# Patient Record
Sex: Female | Born: 1950 | Race: White | Hispanic: No | Marital: Married | Smoking: Former smoker
Health system: Southern US, Community
[De-identification: ages and names within clinical notes are randomized; demographics above are authoritative.]

## PROBLEM LIST (undated history)

## (undated) DIAGNOSIS — R34 Anuria and oliguria: Secondary | ICD-10-CM

## (undated) DIAGNOSIS — K859 Acute pancreatitis without necrosis or infection, unspecified: Secondary | ICD-10-CM

## (undated) DIAGNOSIS — E119 Type 2 diabetes mellitus without complications: Secondary | ICD-10-CM

## (undated) DIAGNOSIS — T4145XA Adverse effect of unspecified anesthetic, initial encounter: Secondary | ICD-10-CM

## (undated) DIAGNOSIS — R82991 Hypocitraturia: Secondary | ICD-10-CM

## (undated) DIAGNOSIS — K567 Ileus, unspecified: Secondary | ICD-10-CM

## (undated) DIAGNOSIS — G4730 Sleep apnea, unspecified: Secondary | ICD-10-CM

## (undated) DIAGNOSIS — K219 Gastro-esophageal reflux disease without esophagitis: Secondary | ICD-10-CM

## (undated) DIAGNOSIS — M9953 Intervertebral disc stenosis of neural canal of lumbar region: Secondary | ICD-10-CM

## (undated) DIAGNOSIS — M722 Plantar fascial fibromatosis: Secondary | ICD-10-CM

## (undated) DIAGNOSIS — D649 Anemia, unspecified: Secondary | ICD-10-CM

## (undated) DIAGNOSIS — R112 Nausea with vomiting, unspecified: Secondary | ICD-10-CM

## (undated) DIAGNOSIS — J45909 Unspecified asthma, uncomplicated: Secondary | ICD-10-CM

## (undated) DIAGNOSIS — K589 Irritable bowel syndrome without diarrhea: Secondary | ICD-10-CM

## (undated) DIAGNOSIS — M533 Sacrococcygeal disorders, not elsewhere classified: Secondary | ICD-10-CM

## (undated) DIAGNOSIS — N289 Disorder of kidney and ureter, unspecified: Secondary | ICD-10-CM

## (undated) DIAGNOSIS — N200 Calculus of kidney: Secondary | ICD-10-CM

## (undated) DIAGNOSIS — E785 Hyperlipidemia, unspecified: Secondary | ICD-10-CM

## (undated) DIAGNOSIS — R1084 Generalized abdominal pain: Secondary | ICD-10-CM

## (undated) DIAGNOSIS — R200 Anesthesia of skin: Secondary | ICD-10-CM

## (undated) DIAGNOSIS — M48062 Spinal stenosis, lumbar region with neurogenic claudication: Secondary | ICD-10-CM

## (undated) DIAGNOSIS — T8859XA Other complications of anesthesia, initial encounter: Secondary | ICD-10-CM

## (undated) DIAGNOSIS — Z9889 Other specified postprocedural states: Secondary | ICD-10-CM

## (undated) DIAGNOSIS — M5136 Other intervertebral disc degeneration, lumbar region: Secondary | ICD-10-CM

## (undated) DIAGNOSIS — K56609 Unspecified intestinal obstruction, unspecified as to partial versus complete obstruction: Secondary | ICD-10-CM

## (undated) HISTORY — PX: PILONIDAL CYST EXCISION: SHX744

## (undated) HISTORY — PX: ABDOMINAL HYSTERECTOMY: SHX81

## (undated) HISTORY — PX: MICRODISCECTOMY LUMBAR: SUR864

## (undated) HISTORY — PX: COLONOSCOPY: SHX174

## (undated) HISTORY — DX: Irritable bowel syndrome, unspecified: K58.9

## (undated) HISTORY — DX: Type 2 diabetes mellitus without complications: E11.9

## (undated) HISTORY — DX: Calculus of kidney: N20.0

## (undated) HISTORY — DX: Spinal stenosis, lumbar region with neurogenic claudication: M48.062

## (undated) HISTORY — DX: Generalized abdominal pain: R10.84

## (undated) HISTORY — PX: EYE SURGERY: SHX253

## (undated) HISTORY — DX: Anemia, unspecified: D64.9

## (undated) HISTORY — DX: Anuria and oliguria: R34

## (undated) HISTORY — DX: Plantar fascial fibromatosis: M72.2

## (undated) HISTORY — DX: Ileus, unspecified: K56.7

## (undated) HISTORY — DX: Other intervertebral disc degeneration, lumbar region: M51.36

## (undated) HISTORY — DX: Hyperlipidemia, unspecified: E78.5

## (undated) HISTORY — DX: Unspecified intestinal obstruction, unspecified as to partial versus complete obstruction: K56.609

## (undated) HISTORY — PX: BACK SURGERY: SHX140

## (undated) HISTORY — PX: CHOLECYSTECTOMY: SHX55

## (undated) HISTORY — DX: Hypocitraturia: R82.991

## (undated) HISTORY — PX: APPENDECTOMY: SHX54

## (undated) HISTORY — DX: Disorder of kidney and ureter, unspecified: N28.9

## (undated) HISTORY — DX: Anesthesia of skin: R20.0

## (undated) HISTORY — DX: Intervertebral disc stenosis of neural canal of lumbar region: M99.53

## (undated) HISTORY — DX: Sacrococcygeal disorders, not elsewhere classified: M53.3

---
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
## 2004-06-28 ENCOUNTER — Emergency Department: Payer: Self-pay | Admitting: Unknown Physician Specialty

## 2004-06-28 ENCOUNTER — Other Ambulatory Visit: Payer: Self-pay

## 2004-07-15 ENCOUNTER — Ambulatory Visit: Payer: Self-pay | Admitting: Family Medicine

## 2004-07-16 ENCOUNTER — Ambulatory Visit: Payer: Self-pay | Admitting: Family Medicine

## 2004-08-23 ENCOUNTER — Ambulatory Visit: Payer: Self-pay | Admitting: Unknown Physician Specialty

## 2004-11-16 ENCOUNTER — Ambulatory Visit: Payer: Self-pay | Admitting: Family Medicine

## 2006-07-18 ENCOUNTER — Ambulatory Visit: Payer: Self-pay | Admitting: Family Medicine

## 2006-08-08 ENCOUNTER — Ambulatory Visit: Payer: Self-pay | Admitting: Family Medicine

## 2007-10-30 ENCOUNTER — Ambulatory Visit: Payer: Self-pay | Admitting: Family Medicine

## 2008-04-07 ENCOUNTER — Ambulatory Visit: Payer: Self-pay | Admitting: Unknown Physician Specialty

## 2008-11-27 ENCOUNTER — Ambulatory Visit: Payer: Self-pay | Admitting: Family Medicine

## 2009-04-22 ENCOUNTER — Ambulatory Visit: Payer: Self-pay | Admitting: Family Medicine

## 2010-03-16 ENCOUNTER — Ambulatory Visit: Payer: Self-pay | Admitting: Family Medicine

## 2011-07-26 ENCOUNTER — Ambulatory Visit: Payer: Self-pay | Admitting: Family Medicine

## 2012-06-06 DIAGNOSIS — R34 Anuria and oliguria: Secondary | ICD-10-CM

## 2012-06-06 DIAGNOSIS — R82991 Hypocitraturia: Secondary | ICD-10-CM | POA: Insufficient documentation

## 2012-06-06 DIAGNOSIS — N200 Calculus of kidney: Secondary | ICD-10-CM | POA: Insufficient documentation

## 2012-06-06 HISTORY — DX: Anuria and oliguria: R34

## 2012-06-06 HISTORY — DX: Hypocitraturia: R82.991

## 2012-07-21 ENCOUNTER — Emergency Department: Payer: Self-pay | Admitting: Emergency Medicine

## 2012-07-21 LAB — COMPREHENSIVE METABOLIC PANEL
Albumin: 3.7 g/dL (ref 3.4–5.0)
Alkaline Phosphatase: 97 U/L (ref 50–136)
BUN: 16 mg/dL (ref 7–18)
Calcium, Total: 9.5 mg/dL (ref 8.5–10.1)
Chloride: 108 mmol/L — ABNORMAL HIGH (ref 98–107)
Co2: 27 mmol/L (ref 21–32)
Creatinine: 1.13 mg/dL (ref 0.60–1.30)
EGFR (African American): 60 — ABNORMAL LOW
EGFR (Non-African Amer.): 52 — ABNORMAL LOW
Osmolality: 282 (ref 275–301)
SGOT(AST): 24 U/L (ref 15–37)
SGPT (ALT): 23 U/L (ref 12–78)
Sodium: 140 mmol/L (ref 136–145)
Total Protein: 7.8 g/dL (ref 6.4–8.2)

## 2012-07-21 LAB — LIPASE, BLOOD: Lipase: 378 U/L (ref 73–393)

## 2012-07-21 LAB — CBC
HCT: 45.2 % (ref 35.0–47.0)
HGB: 15.7 g/dL (ref 12.0–16.0)
MCH: 30.5 pg (ref 26.0–34.0)
MCHC: 34.8 g/dL (ref 32.0–36.0)
RDW: 13 % (ref 11.5–14.5)

## 2012-07-21 LAB — URINALYSIS, COMPLETE
Bilirubin,UR: NEGATIVE
Glucose,UR: NEGATIVE mg/dL (ref 0–75)
Ketone: NEGATIVE
Nitrite: NEGATIVE
Protein: NEGATIVE
RBC,UR: 15 /HPF (ref 0–5)
Specific Gravity: 1.016 (ref 1.003–1.030)
WBC UR: 46 /HPF (ref 0–5)

## 2012-07-26 ENCOUNTER — Ambulatory Visit: Payer: Self-pay | Admitting: Family Medicine

## 2013-07-25 DIAGNOSIS — E119 Type 2 diabetes mellitus without complications: Secondary | ICD-10-CM

## 2013-07-25 HISTORY — DX: Type 2 diabetes mellitus without complications: E11.9

## 2014-01-27 ENCOUNTER — Ambulatory Visit: Payer: Self-pay | Admitting: Family Medicine

## 2014-02-28 HISTORY — PX: MICRODISCECTOMY LUMBAR: SUR864

## 2014-05-07 ENCOUNTER — Other Ambulatory Visit: Payer: Self-pay | Admitting: Family Medicine

## 2014-05-14 ENCOUNTER — Inpatient Hospital Stay: Payer: Self-pay | Admitting: Surgery

## 2014-06-29 NOTE — H&P (Signed)
Subjective/Chief Complaint abdominal pain and distension   History of Present Illness 64 y/o female with type II diabetes presents to ER with about a 24 hours history of severe diffuse abdominal pain with audible bowel sounds starting actually yesterday afternoon,  She had multiple loose stools and last passage of flatus she thinks was yesterday.  No emesis until asked to drink oral contrast for CT scan.  Prior surgical history includes open chole and appendctomy many years ago.  No fevers, no jaundice, no sick contacts, no history of diverticulitis.  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  No prior history of bowel obstructive type symptoms.  recently treated for bronchitis thought secondary to reflux by Her PCP.   Past History obesity DM Renal stones Atrophic right kidney prior open chole open appy   Past Medical Health Diabetes Mellitus   Code Status Full Code   Past Med/Surgical Hx:  Diabetes Mellitus, Type II (NIDD):   Gall Bladder removal:   Appendectomy:   ALLERGIES:  Codeine: Itching  PCN: Rash  HOME MEDICATIONS: Medication Instructions Status  metFORMIN 500 mg oral tablet 1 tab(s) orally 2 times a day Active  Probiotic Formula - oral capsule 1 cap(s) orally once a day Active  Claritin 10 mg oral tablet 1 tab(s) orally once a day Active  Zantac 150 mg oral tablet 1 tab(s) orally 2 times a day Active   Family and Social History:  Family History Diabetes Mellitus   Social History negative tobacco, negative ETOH   Place of Living Home   Review of Systems:  Subjective/Chief Complaint see above, remaining 10 point review otherwise negative or non-contributory.   Fever/Chills No   Cough Yes  recent episode of bronchitis treated with steroids and abx 2 weeks ago.   Sputum No   Abdominal Pain Yes   Diarrhea Yes   Nausea/Vomiting Yes   SOB/DOE No   Chest Pain No   Tolerating Diet No  Nauseated    Medications/Allergies Reviewed Medications/Allergies reviewed   Physical Exam:  GEN no acute distress, obese, disheveled   HEENT pale conjunctivae, PERRL, hearing intact to voice   NECK supple   RESP normal resp effort  clear BS   CARD regular rate   ABD positive tenderness  no hernia  soft  distended  right paramedial scar   LYMPH negative neck   EXTR negative cyanosis/clubbing, negative edema   SKIN normal to palpation, No rashes   NEURO cranial nerves intact   PSYCH A+O to time, place, person   Lab Results:  Hepatic:  16-Mar-16 05:25   Bilirubin, Total 0.7 (0.3-1.2 NOTE: New Reference Range  04/22/14)  Alkaline Phosphatase 87 (38-126 NOTE: New Reference Range  04/22/14)  SGPT (ALT) 21 (14-54 NOTE: New Reference Range  04/22/14)  SGOT (AST) 26 (15-41 NOTE: New Reference Range  04/22/14)  Total Protein, Serum 7.3 (6.5-8.1 NOTE: New Reference Range  04/22/14)  Albumin, Serum 3.8 (3.5-5.0 NOTE: New reference range  04/22/14)  Routine Chem:  16-Mar-16 05:25   Lipase  69 (22-51 NOTE: New Reference Range  04/22/14)  Glucose, Serum  163 (65-99 NOTE: New Reference Range  04/22/14)  BUN  23 (6-20 NOTE: New Reference Range  04/22/14)  Creatinine (comp)  1.17 (0.44-1.00 NOTE: New Reference Range  04/22/14)  Sodium, Serum 139 (135-145 NOTE: New Reference Range  04/22/14)  Potassium, Serum 4.0 (3.5-5.1 NOTE: New Reference Range  04/22/14)  Chloride, Serum 104 (101-111 NOTE: New Reference Range  04/22/14)  CO2, Serum 25 (22-32 NOTE: New Reference Range  04/22/14)  Calcium (Total), Serum 10.0 (8.9-10.3 NOTE: New Reference Range  04/22/14)  eGFR (African American)  57  eGFR (Non-African American)  49 (eGFR values <60mL/min/1.73 m2 may be an indication of chronic kidney disease (CKD). Calculated eGFR is useful in patients with stable renal function. The eGFR calculation will not be reliable in acutely ill patients when serum creatinine is changing  rapidly. It is not useful in patients on dialysis. The eGFR calculation may not be applicable to patients at the low and high extremes of body sizes, pregnant women, and vegetarians.)  Anion Gap 10  Cardiac:  16-Mar-16 05:25   Troponin I <0.03 (0.00-0.03 0.03 ng/mL or less: NEGATIVE  Repeat testing in 3-6 hrs  if clinically indicated. >0.03 ng/mL: POTENTIAL  MYOCARDIAL INJURY. Repeat  testing in 3-6 hrs if  clinically indicated. NOTE: An increase or decrease  of 30% or more on serial  testing suggests a  clinically important change NOTE: New Reference Range  04/22/14)  Routine UA:  16-Mar-16 07:26   Color (UA) Yellow  Clarity (UA) Hazy  Glucose (UA) Negative  Bilirubin (UA) Negative  Ketones (UA) Negative  Specific Gravity (UA) 1.017  Blood (UA) 2+  pH (UA) 5.0  Protein (UA) Negative  Nitrite (UA) Negative  Leukocyte Esterase (UA) 1+ (Result(s) reported on 14 May 2014 at 07:51AM.)  RBC (UA) 1 /HPF  WBC (UA) 25 /HPF  Bacteria (UA) NONE SEEN  Epithelial Cells (UA) 4 /HPF  Mucous (UA) PRESENT  Hyaline Cast (UA) 10 /LPF (Result(s) reported on 14 May 2014 at 07:51AM.)  Routine Hem:  16-Mar-16 05:25   WBC (CBC)  17.9  RBC (CBC)  5.62  Hemoglobin (CBC)  16.2  Hematocrit (CBC)  49.6  Platelet Count (CBC) 328 (Result(s) reported on 14 May 2014 at 06:12AM.)  MCV 88  MCH 28.8  MCHC 32.7  RDW 13.0   Radiology Results: LabUnknown:    16-Mar-16 09:22, CT Abdomen and Pelvis With Contrast  PACS Image  CT:  CT Abdomen and Pelvis With Contrast  REASON FOR EXAM:    (1) upper abd pain; (2) upper abd pain  COMMENTS:       PROCEDURE: CT  - CT ABDOMEN / PELVIS  W  - May 14 2014  9:22AM     CLINICAL DATA:  Severe abdominal pain for the past 12-13 hours.  Nausea, vomiting and diarrhea. History of 1 functioning kidney with  multiple prior kidney infections.    EXAM:  CT ABDOMEN AND PELVIS WITH CONTRAST    TECHNIQUE:  Multidetector CT imaging of the abdomen and pelvis was  performed  using the standard protocol following bolus administration of  intravenous contrast.    CONTRAST:  85 cc Omnipaque 300    COMPARISON:  CT abdomen pelvis - 04/22/2009    FINDINGS:  There is moderate distention of multiple loops of small bowel with  apparent transition site within the anterior aspect of the right mid  hemi abdomen (axial image 48, series 2, coronal image 34, series 6).  There is feculent material seen within knee adjacent small bowel  loop within the right mid hemi abdomen (representative image 55,  series 2). A definitive the etiology of this is obstruction is not  identified. No pneumoperitoneum, pneumatosis or portal venous gas.  There is a trace amount of fluid seen within the right pericolic  gutter and pelvic cul-de-sac without a definable/drainable  intra-abdominal fluid collection.    Scattered   colonic diverticulosis without evidence of diverticulitis.  The appendix is not visualized compatible with provided surgical  history.    Normal hepatic contour. No discrete hepatic lesions. Post  cholecystectomy with mild centralized intrahepatic biliary duct  dilatation, presumably the sequela of reservoir phenomenon. No  ascites.    There is symmetric enhancement and excretion of the bilateral  kidneys. The right kidney remains atrophic. There are multiple (at  least 7) punctate nonobstructing stones seen throughout the atrophic  right kidney with dominant stone within the inferior pole of the  right kidney measuring approximately 6 mm in diameter (coronal image  95, series 6). No definite left-sided renal stones. No stones are  seen along the expected course of either ureter or the urinary  bladder. Normal appearance of the urinary bladder given degree  distention. Sub cm hypo attenuating lesion within the superior  aspect of the right kidney (image 39, series 2) is too small to  actually characterize of favored to represent renal cysts.  No  discrete left-sided renal lesions. No urinary obstruction or  perinephric stranding.    Normal appearance of the bilateral adrenal glands and spleen.    Punctate (approximately 2 mm) calcifications about the head of the  pancreas (representative images 29 and 33, series 2) are unchanged  since the 2011 examination and while nonspecific could be the  sequela of prior episode of pancreatitis. Currently, there is no  peripancreatic stranding. There is homogeneous enhancement of the  pancreatic parenchyma. No definitive pancreatic ductal dilatation.    Moderate to large amount of eccentric mixed calcified and  noncalcified atherosclerotic plaque within a normal caliber  abdominal aorta. Eccentric calcified plaque involving the origin of  the bilateral common iliac arteries (image 53, series 2) not  definitely resulting in hemodynamically significant stenosis.    No bulky retroperitoneal, mesenteric, pelvic or inguinal  lymphadenopathy.    Post hysterectomy. There is a trace amount of fluid within the  pelvic cul-de-sac. No discrete ovarian or adnexal lesion.    Limited visualization of the lower thorax demonstrates minimal  grossly symmetric subpleural ground-glass atelectasis. No discrete  focal airspace opacities. No pleural effusion.    Normal heart size.  No pericardial effusion.    No acute or aggressive osseous abnormalities. Mild to moderate  multilevel lumbar spine DDD with multilevel small posteriorly  directed disc osteophyte complexes throughout the lower lumbar  spine. Mild degenerative change of the right hip. Incidental note is  made of a small os acetabula. Regional soft tissues appear normal.     IMPRESSION:  1. Findings worrisome for developing small bowel obstruction with  transition point within the anterior aspect of the right mid hemi  abdomen. A definitive etiology of this suspected obstruction is not  depicted and thus presumably secondary to adhesions.  No evidence of  perforation or definable/drainable fluid collection.  2. Similar findings of asymmetric right-sided renal atrophy.  3. Multiple punctate nonobstructing right-sided renal stones with  dominant stone within the inferior pole the right kidney measuring 6  mm in diameter. No evidence of left-sided nephrolithiasis.  4. Punctate calcifications about the pancreatic head are unchanged  since the 03/2009 examination and while nonspecific potentially the  sequela of prior episode of pancreatitis. No CT evidence of acute  pancreatitis.  5. Moderate to large amount of calcified plaque within a normal  caliber abdominal aorta.  Electronically Signed    By: Sandi Mariscal M.D.    On: 05/14/2014 09:48  Verified By: JOHN A. WATTS, M.D.,    Assessment/Admission Diagnosis 63 y/o female with obesity, Type II Dm, Atrophic right kidney and what looks like adhesive SBO  Personally spoke with Dr Stafford regarding her care and presentation and ER course  Personally reviewed all CT scan images on PACS.   Plan Admit, hydrate, SSI, NPO, NGT, serial exams, am 2 way series  Discussed in detail with patient and her spouse treatment course including purpose of NGT and possiblity of operative intervention if obtrsuction does not resolve.  They voiced understanding and wich to proceed.   Electronic Signatures: ,  (MD)  (Signed 16-Mar-16 18:28)  Authored: CHIEF COMPLAINT and HISTORY, PAST MEDICAL/SURGIAL HISTORY, ALLERGIES, HOME MEDICATIONS, FAMILY AND SOCIAL HISTORY, REVIEW OF SYSTEMS, PHYSICAL EXAM, LABS, Radiology, ASSESSMENT AND PLAN   Last Updated: 16-Mar-16 18:28 by ,  (MD) 

## 2014-06-29 NOTE — Discharge Summary (Signed)
PATIENT NAME:  Octavio GravesQUIRING, [name] DATE OF BIRTH:  16-Jun-1950  DATE OF ADMISSION:  05/14/2014 DATE OF DISCHARGE:  05/17/2014  FINAL DIAGNOSIS: Adhesive small bowel obstruction.  PRINCIPAL PROCEDURES: CT scan of the abdomen and pelvis, abdominal x-ray, serial abdominal examinations, nasogastric tube decompression.   HOSPITAL COURSE SUMMARY: The patient was admitted to the hospital with a nasogastric tube for small bowel obstruction. She had fairly rapid resolution of her obstruction with passage of gas by postoperative day number 1. Followup consult films done on that day demonstrated contrast seen within the colon from the prior CT scan. By hospital day number 2, the patient continued to improve and was tolerating full liquids after a trial of nasogastric tube to gravity drainage and then following its removal. She did have some discomfort in her epigastric region. A follow up x-ray demonstrated a nonspecific, nonobstructive bowel gas pattern. On the 19th of March, the patient continued to improve, passing gas, tolerating a regular diet and was stable for discharge.   DISCHARGE MEDICATIONS: Metformin 500 mg by mouth b.i.d., probiotic capsule, Claritin 10 mg by mouth once a day, Zantac 150 mg by mouth b.i.d.   DISPOSITION: She is to call the office for any questions or concerns. Follow up in my office next week.   ____________________________ Redge GainerMark A. Egbert GaribaldiBird, MD mab:sb D: 05/22/2014 08:57:06 ET T: 05/22/2014 14:24:18 ET JOB#: [id number]  cc: Loraine LericheMark A. Egbert GaribaldiBird, MD, <Dictator> Raynald KempMARK A Shanta Dorvil MD ELECTRONICALLY SIGNED 05/26/2014 23:16

## 2014-10-09 ENCOUNTER — Other Ambulatory Visit: Payer: Self-pay | Admitting: Physician Assistant

## 2014-10-09 DIAGNOSIS — R200 Anesthesia of skin: Secondary | ICD-10-CM

## 2014-10-09 DIAGNOSIS — M5416 Radiculopathy, lumbar region: Secondary | ICD-10-CM

## 2014-10-09 DIAGNOSIS — M51369 Other intervertebral disc degeneration, lumbar region without mention of lumbar back pain or lower extremity pain: Secondary | ICD-10-CM

## 2014-10-09 DIAGNOSIS — M5136 Other intervertebral disc degeneration, lumbar region: Secondary | ICD-10-CM

## 2014-10-09 HISTORY — DX: Other intervertebral disc degeneration, lumbar region: M51.36

## 2014-10-09 HISTORY — DX: Other intervertebral disc degeneration, lumbar region without mention of lumbar back pain or lower extremity pain: M51.369

## 2014-10-09 HISTORY — DX: Anesthesia of skin: R20.0

## 2014-10-16 ENCOUNTER — Ambulatory Visit: Payer: Self-pay

## 2015-01-01 DIAGNOSIS — M9953 Intervertebral disc stenosis of neural canal of lumbar region: Secondary | ICD-10-CM

## 2015-01-01 HISTORY — DX: Intervertebral disc stenosis of neural canal of lumbar region: M99.53

## 2015-01-19 DIAGNOSIS — M48062 Spinal stenosis, lumbar region with neurogenic claudication: Secondary | ICD-10-CM | POA: Insufficient documentation

## 2015-01-19 HISTORY — DX: Spinal stenosis, lumbar region with neurogenic claudication: M48.062

## 2015-04-23 ENCOUNTER — Other Ambulatory Visit: Payer: Self-pay | Admitting: Family Medicine

## 2015-04-23 DIAGNOSIS — Z1231 Encounter for screening mammogram for malignant neoplasm of breast: Secondary | ICD-10-CM

## 2015-05-11 ENCOUNTER — Ambulatory Visit: Payer: Self-pay | Attending: Family Medicine

## 2015-05-12 ENCOUNTER — Ambulatory Visit
Admission: RE | Admit: 2015-05-12 | Discharge: 2015-05-12 | Disposition: A | Discharge status: Home / Self Care | Payer: Medicare Other | Source: Ambulatory Visit | Attending: Family Medicine | Admitting: Family Medicine

## 2015-05-12 ENCOUNTER — Other Ambulatory Visit: Payer: Self-pay | Admitting: Family Medicine

## 2015-05-12 DIAGNOSIS — Z1231 Encounter for screening mammogram for malignant neoplasm of breast: Secondary | ICD-10-CM

## 2015-10-22 DIAGNOSIS — M533 Sacrococcygeal disorders, not elsewhere classified: Secondary | ICD-10-CM

## 2015-10-22 HISTORY — DX: Sacrococcygeal disorders, not elsewhere classified: M53.3

## 2016-04-12 ENCOUNTER — Emergency Department: Payer: Medicare Other

## 2016-04-12 ENCOUNTER — Inpatient Hospital Stay
Admission: EM | Admit: 2016-04-12 | Discharge: 2016-04-15 | DRG: 390 | Disposition: A | Discharge status: Home / Self Care | Payer: Medicare Other | Attending: Surgery | Admitting: Surgery

## 2016-04-12 ENCOUNTER — Encounter: Payer: Self-pay | Admitting: Emergency Medicine

## 2016-04-12 ENCOUNTER — Observation Stay: Payer: Medicare Other

## 2016-04-12 DIAGNOSIS — R1084 Generalized abdominal pain: Secondary | ICD-10-CM | POA: Insufficient documentation

## 2016-04-12 DIAGNOSIS — R109 Unspecified abdominal pain: Secondary | ICD-10-CM

## 2016-04-12 DIAGNOSIS — Z6835 Body mass index (BMI) 35.0-35.9, adult: Secondary | ICD-10-CM

## 2016-04-12 DIAGNOSIS — Z885 Allergy status to narcotic agent status: Secondary | ICD-10-CM

## 2016-04-12 DIAGNOSIS — K56609 Unspecified intestinal obstruction, unspecified as to partial versus complete obstruction: Secondary | ICD-10-CM | POA: Diagnosis present

## 2016-04-12 DIAGNOSIS — Z91048 Other nonmedicinal substance allergy status: Secondary | ICD-10-CM

## 2016-04-12 DIAGNOSIS — K567 Ileus, unspecified: Secondary | ICD-10-CM

## 2016-04-12 DIAGNOSIS — Z9049 Acquired absence of other specified parts of digestive tract: Secondary | ICD-10-CM

## 2016-04-12 DIAGNOSIS — Z90710 Acquired absence of both cervix and uterus: Secondary | ICD-10-CM

## 2016-04-12 DIAGNOSIS — K56600 Partial intestinal obstruction, unspecified as to cause: Principal | ICD-10-CM | POA: Diagnosis present

## 2016-04-12 DIAGNOSIS — Z880 Allergy status to penicillin: Secondary | ICD-10-CM

## 2016-04-12 DIAGNOSIS — E119 Type 2 diabetes mellitus without complications: Secondary | ICD-10-CM | POA: Diagnosis present

## 2016-04-12 HISTORY — DX: Type 2 diabetes mellitus without complications: E11.9

## 2016-04-12 LAB — CBC
HCT: 48.6 % — ABNORMAL HIGH (ref 35.0–47.0)
HEMATOCRIT: 46.2 % (ref 35.0–47.0)
HEMOGLOBIN: 15.4 g/dL (ref 12.0–16.0)
Hemoglobin: 16.6 g/dL — ABNORMAL HIGH (ref 12.0–16.0)
MCH: 29.3 pg (ref 26.0–34.0)
MCH: 28.7 pg (ref 26.0–34.0)
MCHC: 34.2 g/dL (ref 32.0–36.0)
MCHC: 33.3 g/dL (ref 32.0–36.0)
MCV: 85.6 fL (ref 80.0–100.0)
MCV: 86.4 fL (ref 80.0–100.0)
PLATELETS: 234 10*3/uL (ref 150–440)
Platelets: 203 10*3/uL (ref 150–440)
RBC: 5.68 MIL/uL — ABNORMAL HIGH (ref 3.80–5.20)
RBC: 5.35 MIL/uL — AB (ref 3.80–5.20)
RDW: 13.2 % (ref 11.5–14.5)
RDW: 13.1 % (ref 11.5–14.5)
WBC: 11.9 10*3/uL — ABNORMAL HIGH (ref 3.6–11.0)
WBC: 15 10*3/uL — ABNORMAL HIGH (ref 3.6–11.0)

## 2016-04-12 LAB — CREATININE, SERUM
Creatinine, Ser: 1.32 mg/dL — ABNORMAL HIGH (ref 0.44–1.00)
GFR calc Af Amer: 47 mL/min — ABNORMAL LOW (ref >60)
GFR, EST NON AFRICAN AMERICAN: 41 mL/min — AB (ref >60)

## 2016-04-12 LAB — URINALYSIS, COMPLETE (UACMP) WITH MICROSCOPIC
Bacteria, UA: NONE SEEN
Bilirubin Urine: NEGATIVE
GLUCOSE, UA: NEGATIVE mg/dL
Hgb urine dipstick: NEGATIVE
Ketones, ur: NEGATIVE mg/dL
Nitrite: NEGATIVE
PH: 5 (ref 5.0–8.0)
Protein, ur: 30 mg/dL — AB
RBC / HPF: NONE SEEN RBC/hpf (ref 0–5)
Specific Gravity, Urine: 1.02 (ref 1.005–1.030)

## 2016-04-12 LAB — COMPREHENSIVE METABOLIC PANEL
ALK PHOS: 64 U/L (ref 38–126)
ALT: 18 U/L (ref 14–54)
ANION GAP: 10 (ref 5–15)
AST: 37 U/L (ref 15–41)
Albumin: 4.5 g/dL (ref 3.5–5.0)
BILIRUBIN TOTAL: 0.7 mg/dL (ref 0.3–1.2)
BUN: 14 mg/dL (ref 6–20)
CALCIUM: 10.8 mg/dL — AB (ref 8.9–10.3)
CO2: 25 mmol/L (ref 22–32)
CREATININE: 1.12 mg/dL — AB (ref 0.44–1.00)
Chloride: 105 mmol/L (ref 101–111)
GFR calc Af Amer: 58 mL/min — ABNORMAL LOW (ref >60)
GFR calc non Af Amer: 50 mL/min — ABNORMAL LOW (ref >60)
GLUCOSE: 192 mg/dL — AB (ref 65–99)
Potassium: 4.6 mmol/L (ref 3.5–5.1)
Sodium: 140 mmol/L (ref 135–145)
TOTAL PROTEIN: 7.9 g/dL (ref 6.5–8.1)

## 2016-04-12 LAB — LIPASE, BLOOD: Lipase: 44 U/L (ref 11–51)

## 2016-04-12 MED ORDER — ONDANSETRON HCL 4 MG/2ML IJ SOLN
INTRAMUSCULAR | Status: AC
  Administered 2016-04-12: 4 mg via INTRAVENOUS
  Filled 2016-04-12: qty 2

## 2016-04-12 MED ORDER — MORPHINE SULFATE (PF) 4 MG/ML IV SOLN
4.0000 mg | Freq: Once | INTRAVENOUS | Status: AC
  Administered 2016-04-12: 4 mg via INTRAVENOUS
  Filled 2016-04-12: qty 1

## 2016-04-12 MED ORDER — ONDANSETRON HCL 4 MG/2ML IJ SOLN
4.0000 mg | Freq: Once | INTRAMUSCULAR | Status: AC
  Administered 2016-04-12: 4 mg via INTRAVENOUS

## 2016-04-12 MED ORDER — HEPARIN SODIUM (PORCINE) 5000 UNIT/ML IJ SOLN
5000.0000 [IU] | Freq: Three times a day (TID) | INTRAMUSCULAR | Status: DC
  Administered 2016-04-12 – 2016-04-15 (×8): 5000 [IU] via SUBCUTANEOUS
  Filled 2016-04-12 (×9): qty 1

## 2016-04-12 MED ORDER — IOPAMIDOL (ISOVUE-300) INJECTION 61%
30.0000 mL | Freq: Once | INTRAVENOUS | Status: AC | PRN
  Administered 2016-04-12: 30 mL via ORAL

## 2016-04-12 MED ORDER — ONDANSETRON HCL 4 MG PO TABS: 4.0000 mg | ORAL_TABLET | Freq: Four times a day (QID) | ORAL | Status: DC | PRN

## 2016-04-12 MED ORDER — IOPAMIDOL (ISOVUE-300) INJECTION 61%
100.0000 mL | Freq: Once | INTRAVENOUS | Status: AC | PRN
  Administered 2016-04-12: 100 mL via INTRAVENOUS

## 2016-04-12 MED ORDER — PANTOPRAZOLE SODIUM 40 MG IV SOLR
40.0000 mg | Freq: Two times a day (BID) | INTRAVENOUS | Status: DC
  Administered 2016-04-12 – 2016-04-15 (×6): 40 mg via INTRAVENOUS
  Filled 2016-04-12 (×6): qty 40

## 2016-04-12 MED ORDER — HYDROMORPHONE HCL 1 MG/ML IJ SOLN
0.5000 mg | INTRAMUSCULAR | Status: DC | PRN
  Administered 2016-04-12 – 2016-04-13 (×4): 0.5 mg via INTRAVENOUS
  Filled 2016-04-12 (×3): qty 0.5
  Filled 2016-04-12: qty 1

## 2016-04-12 MED ORDER — FENTANYL CITRATE (PF) 100 MCG/2ML IJ SOLN
75.0000 ug | Freq: Once | INTRAMUSCULAR | Status: AC
  Administered 2016-04-12: 75 ug via INTRAVENOUS
  Filled 2016-04-12: qty 2

## 2016-04-12 MED ORDER — ONDANSETRON HCL 4 MG/2ML IJ SOLN
4.0000 mg | Freq: Once | INTRAMUSCULAR | Status: AC
  Administered 2016-04-12: 4 mg via INTRAVENOUS
  Filled 2016-04-12: qty 2

## 2016-04-12 MED ORDER — ONDANSETRON HCL 4 MG/2ML IJ SOLN
4.0000 mg | Freq: Four times a day (QID) | INTRAMUSCULAR | Status: DC | PRN
  Administered 2016-04-12 – 2016-04-14 (×5): 4 mg via INTRAVENOUS
  Filled 2016-04-12 (×6): qty 2

## 2016-04-12 MED ORDER — LACTATED RINGERS IV SOLN
INTRAVENOUS | Status: DC
  Administered 2016-04-12 – 2016-04-14 (×7): via INTRAVENOUS

## 2016-04-12 NOTE — H&P (Signed)
Alexa Abbott is an 66 y.o. female.    Chief Complaint: Nausea vomiting  HPI: This patient with a 24-hour history of nausea and vomiting she and her husband 8 dinner the night before home cooked and both of them woke up with abdominal discomfort but his spontaneously resolved. Hers progressed to where she vomited a single time about 800 cc she's had some upper abdominal pain but no bowel movements and no passage of gas since yesterday. This is unusual for her and that she says she has IBS and has up to 10 loose stools per day. This is very unusual for her to not have bowel movements or pass gas.  She had this happen once before and was admitted to the hospital with a nasogastric tube by Dr. Marina Gravel. It spontaneously resolved without surgery.  Of interest is multiple surgeries in the past including hysterectomy cholecystectomy. She has a long paramedian and Kocher type scar.  Past Medical History:  Diagnosis Date  . Diabetes mellitus without complication ([place])     History reviewed. No pertinent surgical history.  No family history on file. There is no family history of bowel obstruction but there is a history of diabetes. Social History:  reports that she has never smoked. She has never used smokeless tobacco. She reports that she drinks alcohol. Her drug history is not on file.  Allergies:  Allergies  Allergen Reactions  . Adhesive [Tape]   . Codeine Itching  . Penicillins Rash    Has patient had a PCN reaction causing immediate rash, facial/tongue/throat swelling, SOB or lightheadedness with hypotension: yes Has patient had a PCN reaction causing severe rash involving mucus membranes or skin necrosis: no Has patient had a PCN reaction that required hospitalization no Has patient had a PCN reaction occurring within the last 10 years: no If all of the above answers are "NO", then may proceed with Cephalosporin use.      (Not in a hospital admission)   Review of Systems   Constitutional: Negative.   HENT: Negative.   Eyes: Negative.   Respiratory: Negative.   Cardiovascular: Negative.   Gastrointestinal: Positive for abdominal pain, heartburn, nausea and vomiting. Negative for blood in stool, constipation, diarrhea and melena.  Genitourinary: Negative.   Musculoskeletal: Negative.   Skin: Negative.   Neurological: Negative.   Endo/Heme/Allergies: Negative.   Psychiatric/Behavioral: Negative.      Physical Exam:  BP (!) 163/83   Pulse 100   Temp 98.9 F (37.2 C) (Oral)   Resp (!) 29   Ht '5\' 1"'$  (1.549 m)   Wt 190 lb 6.4 oz (86.4 kg)   SpO2 98%   BMI 35.98 kg/m   Physical Exam  Constitutional: She is oriented to person, place, and time and well-developed, well-nourished, and in no distress. No distress.  Morbidly obese no acute distress  HENT:  Head: Normocephalic and atraumatic.  Eyes: Pupils are equal, round, and reactive to light. Right eye exhibits no discharge. Left eye exhibits no discharge. No scleral icterus.  Neck: Normal range of motion.  Cardiovascular: Normal rate, regular rhythm and normal heart sounds.   Pulmonary/Chest: Effort normal and breath sounds normal. No respiratory distress. She has no wheezes. She has no rales.  Abdominal: Soft. She exhibits distension. There is tenderness. There is no rebound and no guarding.  Musculoskeletal: Normal range of motion. She exhibits no edema or tenderness.  Lymphadenopathy:    She has no cervical adenopathy.  Neurological: She is alert and oriented to person,  place, and time.  Skin: Skin is warm and dry. She is not diaphoretic. No erythema. No pallor.  Psychiatric: Mood and affect normal.  Vitals reviewed.       Results for orders placed or performed during the hospital encounter of 04/12/16 (from the past 48 hour(s))  Lipase, blood     Status: None   Collection Time: 04/12/16  2:53 PM  Result Value Ref Range   Lipase 44 11 - 51 U/L  Comprehensive metabolic panel      Status: Abnormal   Collection Time: 04/12/16  2:53 PM  Result Value Ref Range   Sodium 140 135 - 145 mmol/L   Potassium 4.6 3.5 - 5.1 mmol/L   Chloride 105 101 - 111 mmol/L   CO2 25 22 - 32 mmol/L   Glucose, Bld 192 (H) 65 - 99 mg/dL   BUN 14 6 - 20 mg/dL   Creatinine, Ser 1.12 (H) 0.44 - 1.00 mg/dL   Calcium 10.8 (H) 8.9 - 10.3 mg/dL   Total Protein 7.9 6.5 - 8.1 g/dL   Albumin 4.5 3.5 - 5.0 g/dL   AST 37 15 - 41 U/L   ALT 18 14 - 54 U/L   Alkaline Phosphatase 64 38 - 126 U/L   Total Bilirubin 0.7 0.3 - 1.2 mg/dL   GFR calc non Af Amer 50 (L) >60 mL/min   GFR calc Af Amer 58 (L) >60 mL/min    Comment: (NOTE) The eGFR has been calculated using the CKD EPI equation. This calculation has not been validated in all clinical situations. eGFR's persistently <60 mL/min signify possible Chronic Kidney Disease.    Anion gap 10 5 - 15  CBC     Status: Abnormal   Collection Time: 04/12/16  2:53 PM  Result Value Ref Range   WBC 11.9 (H) 3.6 - 11.0 K/uL   RBC 5.68 (H) 3.80 - 5.20 MIL/uL   Hemoglobin 16.6 (H) 12.0 - 16.0 g/dL   HCT 48.6 (H) 35.0 - 47.0 %   MCV 85.6 80.0 - 100.0 fL   MCH 29.3 26.0 - 34.0 pg   MCHC 34.2 32.0 - 36.0 g/dL   RDW 13.2 11.5 - 14.5 %   Platelets 234 150 - 440 K/uL  Urinalysis, Complete w Microscopic     Status: Abnormal   Collection Time: 04/12/16  5:30 PM  Result Value Ref Range   Color, Urine YELLOW (A) YELLOW   APPearance CLEAR (A) CLEAR   Specific Gravity, Urine 1.020 1.005 - 1.030   pH 5.0 5.0 - 8.0   Glucose, UA NEGATIVE NEGATIVE mg/dL   Hgb urine dipstick NEGATIVE NEGATIVE   Bilirubin Urine NEGATIVE NEGATIVE   Ketones, ur NEGATIVE NEGATIVE mg/dL   Protein, ur 30 (A) NEGATIVE mg/dL   Nitrite NEGATIVE NEGATIVE   Leukocytes, UA TRACE (A) NEGATIVE   RBC / HPF NONE SEEN 0 - 5 RBC/hpf   WBC, UA 0-5 0 - 5 WBC/hpf   Bacteria, UA NONE SEEN NONE SEEN   Squamous Epithelial / LPF 0-5 (A) NONE SEEN   Mucous PRESENT    Hyaline Casts, UA PRESENT    Ct  Abdomen Pelvis W Contrast  Result Date: 04/12/2016 CLINICAL DATA:  Acute onset of generalized abdominal pain and nausea. Lack of bowel movements. Initial encounter. EXAM: CT ABDOMEN AND PELVIS WITH CONTRAST TECHNIQUE: Multidetector CT imaging of the abdomen and pelvis was performed using the standard protocol following bolus administration of intravenous contrast. CONTRAST:  135m ISOVUE-300 IOPAMIDOL (ISOVUE-300) INJECTION 61%  COMPARISON:  CT of the abdomen and pelvis from 05/14/2014 FINDINGS: Lower chest: The visualized lung bases are grossly clear. Scattered coronary artery calcifications are seen. Hepatobiliary: The patient is status post cholecystectomy. There is minimal prominence of the intrahepatic biliary ducts, within normal limits. The liver is unremarkable in appearance. The common bile duct remains normal in caliber. Pancreas: The pancreas is within normal limits. Spleen: The spleen is unremarkable in appearance. Adrenals/Urinary Tract: The adrenal glands are unremarkable in appearance. The kidneys are within normal limits Mild right renal atrophy and scarring is noted, with numerous right renal stones measuring up to 9 mm in size. There is no evidence of hydronephrosis. No obstructing ureteral stones are seen. Minimal nonspecific left-sided perinephric stranding is noted. The left kidney is otherwise unremarkable. Stomach/Bowel: There is distention of small-bowel loops up to 3.8 cm in diameter, with gradual decompression at the right lower quadrant. No focal transition point or fecalization is seen. This may reflect some degree of small bowel dysmotility. The stomach is partially filled with fluid and air, and is grossly unremarkable in appearance. The appendix is not well characterized; there is no evidence of appendicitis. The colon is grossly unremarkable in appearance. Vascular/Lymphatic: Scattered calcification is seen along the abdominal aorta and its branches. Minimal mural thrombus is noted  along the distal abdominal aorta, without evidence of significant luminal narrowing. The abdominal aorta is otherwise grossly unremarkable. The inferior vena cava is grossly unremarkable. No retroperitoneal lymphadenopathy is seen. No pelvic sidewall lymphadenopathy is identified. Reproductive: The bladder is mildly distended and grossly unremarkable. The patient is status post hysterectomy. The ovaries are relatively symmetric. No suspicious adnexal masses are seen. Other: No additional soft tissue abnormalities are seen. Musculoskeletal: No acute osseous abnormalities are identified. Anterior bridging osteophytes are noted along the lower thoracic spine. Small posterior disc protrusions are seen along the lumbar spine. The visualized musculature is unremarkable in appearance. IMPRESSION: 1. Distention of small-bowel loops up to 3.8 cm in diameter, with gradual decompression at the right lower quadrant. No focal transition point or fecalization seen. This may reflect some degree of small bowel dysmotility, without definite evidence for bowel obstruction at this time. Would follow-up with the patient's symptoms. 2. Mild right renal atrophy and scarring, with numerous right renal stones measuring up to 9 mm in size. 3. Scattered aortic atherosclerosis. Minimal mural thrombus along the distal abdominal aorta, without significant luminal narrowing. 4. Scattered coronary artery calcifications seen. 5. Mild degenerative change along the lower thoracic and lumbar spine. Electronically Signed   By: Garald Balding M.D.   On: 04/12/2016 17:38   Dg Abdomen Acute W/chest  Result Date: 04/12/2016 CLINICAL DATA:  Epigastric pain. EXAM: DG ABDOMEN ACUTE W/ 1V CHEST COMPARISON:  05/16/2014. FINDINGS: Mediastinum hilar structures normal. Heart size normal. No focal infiltrate. Low lung volumes. Distended loops of small bowel noted. Developing small bowel obstruction cannot be excluded . Colonic gas pattern is normal. Stool in  colon. Follow-up abdominal series suggested to demonstrate resolution of small bowel distention. Calcifications in the pelvis consistent phleboliths. IMPRESSION: Dilated loops of small bowel. Developing small bowel obstruction cannot be excluded. Follow-up abdominal series suggested. Electronically Signed   By: Marcello Moores  Register   On: 04/12/2016 15:56     Assessment/Plan  This patient with a probable small bowel obstruction. She's had a single emesis of large volume and that necessitating its and nasogastric tube which I think would benefit her greatly. Plan would be for admission to the hospital nasogastric tube placement  and hydration and follow-up films in the morning. This discussed with she and her husband they understood and agreed with this plan she has had this happen before in a similar fashion and spontaneously resolved.  Florene Glen, MD, FACS

## 2016-04-12 NOTE — ED Notes (Signed)
Patient transported to CT 

## 2016-04-12 NOTE — ED Triage Notes (Addendum)
Pt with abd pain started last night and continues on today. Pt with hx of bowel obstruction, normally has 5-10 BM's per day, pt with IBS. Pt has not had BM today. Pt states she had not vomited but is very nauseated.

## 2016-04-12 NOTE — ED Notes (Signed)
Pt's O2 saturation dropped to 87% on room air, pt denied feeling SOB. 2L of oxygen applied to pt, saturation rose to 94%. Will continue to monitor.

## 2016-04-12 NOTE — ED Provider Notes (Signed)
[facility] Emergency Department Provider Note ____________________________________________   I have reviewed the triage vital signs and the triage nursing note.  HISTORY  Chief Complaint Abdominal Pain and Constipation   Historian Patient  HPI Alexa Abbott is a 66 y.o. female history of diabetes as well as a small bowel obstruction approximately 2 years ago that resolved with 3 days of conservative management Hospital, presents today with abdominal pain that started last evening and is now diffuse and associated with abdominal bloating. She has not had a bowel movement since yesterday and typically would have up to 10 per day. She is nauseated without vomiting. Pain is 7 out of 10. Pain is diffuse but more so on the right lower quadrant which is where her previous bowel obstruction pain occurred as well.    Past Medical History:  Diagnosis Date  . Diabetes mellitus without complication (HCC)     There are no active problems to display for this patient.   History reviewed. No pertinent surgical history.  Prior to Admission medications   Medication Sig Start Date End Date Taking? Authorizing Provider  acetaminophen (TYLENOL) 500 MG tablet Take 1,000 mg by mouth every 6 (six) hours as needed.   Yes Historical Provider, MD  Lactobacillus (PROBIOTIC ACIDOPHILUS PO) Take 1 tablet by mouth daily.   Yes Historical Provider, MD  loratadine (CLARITIN) 10 MG tablet Take 10 mg by mouth daily.   Yes Historical Provider, MD  metFORMIN (GLUCOPHAGE-XR) 500 MG 24 hr tablet Take 500 mg by mouth 2 (two) times daily. 12/11/15  Yes Historical Provider, MD  ranitidine (ZANTAC) 150 MG tablet Take 150 mg by mouth 2 (two) times daily. 01/28/16  Yes Historical Provider, MD    Allergies  Allergen Reactions  . Adhesive [Tape]   . Codeine Itching  . Penicillins Rash    Has patient had a PCN reaction causing immediate rash, facial/tongue/throat swelling, SOB or lightheadedness  with hypotension: yes Has patient had a PCN reaction causing severe rash involving mucus membranes or skin necrosis: no Has patient had a PCN reaction that required hospitalization no Has patient had a PCN reaction occurring within the last 10 years: no If all of the above answers are "NO", then may proceed with Cephalosporin use.     No family history on file.  Social History Social History  Substance Use Topics  . Smoking status: Never Smoker  . Smokeless tobacco: Never Used  . Alcohol use Yes     Comment: occasionally    Review of Systems  Constitutional: Negative for fever. Eyes: Negative for visual changes. ENT: Negative for sore throat. Cardiovascular: Negative for chest pain. Respiratory: Negative for shortness of breath. Gastrointestinal: Negative for vomiting and diarrhea. Genitourinary: Negative for dysuria. Musculoskeletal: Negative for back pain. Skin: Negative for rash. Neurological: Negative for headache. 10 point Review of Systems otherwise negative ____________________________________________   PHYSICAL EXAM:  VITAL SIGNS: ED Triage Vitals  Enc Vitals Group     BP 04/12/16 1443 (!) 179/87     Pulse Rate 04/12/16 1443 (!) 109     Resp 04/12/16 1443 20     Temp 04/12/16 1443 98.9 F (37.2 C)     Temp Source 04/12/16 1443 Oral     SpO2 04/12/16 1443 97 %     Weight 04/12/16 1444 190 lb 6.4 oz (86.4 kg)     Height 04/12/16 1444 5\' 1"  (1.549 m)     Head Circumference --      Peak Flow --  Pain Score 04/12/16 1444 10     Pain Loc --      Pain Edu? --      Excl. in GC? --      Constitutional: Alert and oriented. Well appearing and in no distress. HEENT   Head: Normocephalic and atraumatic.      Eyes: Conjunctivae are normal. PERRL. Normal extraocular movements.      Ears:         Nose: No congestion/rhinnorhea.   Mouth/Throat: Mucous membranes are moist.   Neck: No stridor. Cardiovascular/Chest: Normal rate, regular rhythm.  No  murmurs, rubs, or gallops. Respiratory: Normal respiratory effort without tachypnea nor retractions. Breath sounds are clear and equal bilaterally. No wheezes/rales/rhonchi. Gastrointestinal: Soft. Moderate distention. Moderate diffuse tenderness without guarding or rebound.   Genitourinary/rectal:Deferred Musculoskeletal: Nontender with normal range of motion in all extremities. No joint effusions.  No lower extremity tenderness.  No edema. Neurologic:  Normal speech and language. No gross or focal neurologic deficits are appreciated. Skin:  Skin is warm, dry and intact. No rash noted. Psychiatric: Mood and affect are normal. Speech and behavior are normal. Patient exhibits appropriate insight and judgment.   ____________________________________________  LABS (pertinent positives/negatives)  Labs Reviewed  COMPREHENSIVE METABOLIC PANEL - Abnormal; Notable for the following:       Result Value   Glucose, Bld 192 (*)    Creatinine, Ser 1.12 (*)    Calcium 10.8 (*)    GFR calc non Af Amer 50 (*)    GFR calc Af Amer 58 (*)    All other components within normal limits  CBC - Abnormal; Notable for the following:    WBC 11.9 (*)    RBC 5.68 (*)    Hemoglobin 16.6 (*)    HCT 48.6 (*)    All other components within normal limits  URINALYSIS, COMPLETE (UACMP) WITH MICROSCOPIC - Abnormal; Notable for the following:    Color, Urine YELLOW (*)    APPearance CLEAR (*)    Protein, ur 30 (*)    Leukocytes, UA TRACE (*)    Squamous Epithelial / LPF 0-5 (*)    All other components within normal limits  LIPASE, BLOOD    ____________________________________________    EKG I, Governor Rooksebecca Travus Oren, MD, the attending physician have personally viewed and interpreted all ECGs.  94 bpm. Normal sinus rhythm. Left axis deviation. Nonspecific ST and T-wave. ____________________________________________  RADIOLOGY All Xrays were viewed by me. Imaging interpreted by Radiologist.  Abdomen with chest  x-rays:  IMPRESSION: Dilated loops of small bowel. Developing small bowel obstruction cannot be excluded. Follow-up abdominal series suggested.  CT abdomen and pelvis with contrast:  IMPRESSION: 1. Distention of small-bowel loops up to 3.8 cm in diameter, with gradual decompression at the right lower quadrant. No focal transition point or fecalization seen. This may reflect some degree of small bowel dysmotility, without definite evidence for bowel obstruction at this time. Would follow-up with the patient's symptoms. 2. Mild right renal atrophy and scarring, with numerous right renal stones measuring up to 9 mm in size. 3. Scattered aortic atherosclerosis. Minimal mural thrombus along the distal abdominal aorta, without significant luminal narrowing. 4. Scattered coronary artery calcifications seen. 5. Mild degenerative change along the lower thoracic and lumbar spine. __________________________________________  PROCEDURES  Procedure(s) performed: None  Critical Care performed: None  ____________________________________________   ED COURSE / ASSESSMENT AND PLAN  Pertinent labs & imaging results that were available during my care of the patient were reviewed by me and  considered in my medical decision making (see chart for details).   Symptoms are clinically concerning for small bowel obstruction. She does not have acute abdomen on exam, but was sent for x-rays, showing developing small bowel obstruction potentially. CT scan showed no focal transition point, however there are dilated small bowel loops.  Patient was treated with symptomatic medications, and required ongoing/repeated narcotics for pain control. Gen. Surgery consultation for admission.      CONSULTATIONS:  Dr Excell Seltzer, general surgery for admission.  Patient / Family / Caregiver informed of clinical course, medical decision-making process, and agree with  plan.    ___________________________________________   FINAL CLINICAL IMPRESSION(S) / ED DIAGNOSES   Final diagnoses:  Abdominal pain, generalized              Note: This dictation was prepared with Dragon dictation. Any transcriptional errors that result from this process are unintentional    Governor Rooks, MD 04/12/16 [phone]

## 2016-04-12 NOTE — ED Notes (Signed)
Pt had 1 episode of emesis. MD notified.

## 2016-04-12 NOTE — ED Notes (Signed)
Patient transported to X-ray 

## 2016-04-13 ENCOUNTER — Observation Stay: Payer: Medicare Other

## 2016-04-13 DIAGNOSIS — K56609 Unspecified intestinal obstruction, unspecified as to partial versus complete obstruction: Secondary | ICD-10-CM | POA: Diagnosis present

## 2016-04-13 DIAGNOSIS — Z90710 Acquired absence of both cervix and uterus: Secondary | ICD-10-CM | POA: Diagnosis not present

## 2016-04-13 DIAGNOSIS — Z6835 Body mass index (BMI) 35.0-35.9, adult: Secondary | ICD-10-CM | POA: Diagnosis not present

## 2016-04-13 DIAGNOSIS — Z91048 Other nonmedicinal substance allergy status: Secondary | ICD-10-CM | POA: Diagnosis not present

## 2016-04-13 DIAGNOSIS — E119 Type 2 diabetes mellitus without complications: Secondary | ICD-10-CM | POA: Diagnosis present

## 2016-04-13 DIAGNOSIS — R1084 Generalized abdominal pain: Secondary | ICD-10-CM | POA: Diagnosis present

## 2016-04-13 DIAGNOSIS — K567 Ileus, unspecified: Secondary | ICD-10-CM | POA: Diagnosis not present

## 2016-04-13 DIAGNOSIS — Z885 Allergy status to narcotic agent status: Secondary | ICD-10-CM | POA: Diagnosis not present

## 2016-04-13 DIAGNOSIS — Z880 Allergy status to penicillin: Secondary | ICD-10-CM | POA: Diagnosis not present

## 2016-04-13 DIAGNOSIS — Z9049 Acquired absence of other specified parts of digestive tract: Secondary | ICD-10-CM | POA: Diagnosis not present

## 2016-04-13 DIAGNOSIS — K56600 Partial intestinal obstruction, unspecified as to cause: Secondary | ICD-10-CM | POA: Diagnosis present

## 2016-04-13 LAB — BASIC METABOLIC PANEL
ANION GAP: 6 (ref 5–15)
BUN: 14 mg/dL (ref 6–20)
CALCIUM: 9.6 mg/dL (ref 8.9–10.3)
CO2: 28 mmol/L (ref 22–32)
CREATININE: 1.27 mg/dL — AB (ref 0.44–1.00)
Chloride: 105 mmol/L (ref 101–111)
GFR calc Af Amer: 49 mL/min — ABNORMAL LOW (ref >60)
GFR calc non Af Amer: 43 mL/min — ABNORMAL LOW (ref >60)
Glucose, Bld: 117 mg/dL — ABNORMAL HIGH (ref 65–99)
Potassium: 4.9 mmol/L (ref 3.5–5.1)
SODIUM: 139 mmol/L (ref 135–145)

## 2016-04-13 LAB — CBC
HCT: 38.8 % (ref 35.0–47.0)
HEMOGLOBIN: 13.7 g/dL (ref 12.0–16.0)
MCH: 30.2 pg (ref 26.0–34.0)
MCHC: 35.2 g/dL (ref 32.0–36.0)
MCV: 85.6 fL (ref 80.0–100.0)
PLATELETS: 163 10*3/uL (ref 150–440)
RBC: 4.53 MIL/uL (ref 3.80–5.20)
RDW: 13.1 % (ref 11.5–14.5)
WBC: 7.4 10*3/uL (ref 3.6–11.0)

## 2016-04-13 MED ORDER — MUPIROCIN 2 % EX OINT
TOPICAL_OINTMENT | Freq: Two times a day (BID) | CUTANEOUS | Status: DC
  Administered 2016-04-13 – 2016-04-14 (×3): via NASAL
  Administered 2016-04-15: 1 via NASAL
  Filled 2016-04-13: qty 22

## 2016-04-13 NOTE — Care Management Obs Status (Signed)
MEDICARE OBSERVATION STATUS NOTIFICATION   Patient Details  Name: Alexa GravesDonna B Abbott MRN: [id number] Date of Birth: [birth year]   Medicare Observation Status Notification Given:  No (Attempted to give obs letter.  Patient vomiting this time.  )    Chapman FitchBOWEN, Ashwini Jago T, RN 04/13/2016, 4:30 PM

## 2016-04-13 NOTE — Progress Notes (Signed)
Dr Everlene FarrierPabon gave orders for NPO except ice chips

## 2016-04-13 NOTE — Progress Notes (Signed)
CC: ileus Subjective: Pt feeling better, some abdominal pain but has improved. No flatus KUB personally reviewed contrast in the colon, no bowel distension\  Objective: Vital signs in last 24 hours: Temp:  [97.5 F (36.4 C)-98.9 F (37.2 C)] 97.6 F (36.4 C) (02/14 1253) Pulse Rate:  [78-109] 78 (02/14 1253) Resp:  [14-29] 18 (02/14 1253) BP: (104-179)/(44-91) 128/50 (02/14 1253) SpO2:  [92 %-99 %] 93 % (02/14 1253) Weight:  [86.4 kg (190 lb 6.4 oz)] 86.4 kg (190 lb 6.4 oz) (02/13 1444) Last BM Date: 04/11/16  Intake/Output from previous day: 02/13 0701 - 02/14 0700 In: 165 [I.V.:165] Out: 300 [Urine:200; Emesis/NG output:100] Intake/Output this shift: Total I/O In: 872.5 [I.V.:872.5] Out: 650 [Urine:500; Emesis/NG output:150]  Physical exam: NAD awake alert Abd: soft, Nt, no peritonitis, no distension\ Ext: well perfused, no edema   Lab Results: CBC   Recent Labs  04/12/16 2058 04/13/16 0624  WBC 15.0* 7.4  HGB 15.4 13.7  HCT 46.2 38.8  PLT 203 163   BMET  Recent Labs  04/12/16 1453 04/12/16 2058 04/13/16 0624  NA 140  --  139  K 4.6  --  4.9  CL 105  --  105  CO2 25  --  28  GLUCOSE 192*  --  117*  BUN 14  --  14  CREATININE 1.12* 1.32* 1.27*  CALCIUM 10.8*  --  9.6   PT/INR No results for input(s): LABPROT, INR in the last 72 hours. ABG No results for input(s): PHART, HCO3 in the last 72 hours.  Invalid input(s): PCO2, PO2  Studies/Results: Dg Abdomen 1 View  Result Date: 04/12/2016 CLINICAL DATA:  NG tube placement EXAM: ABDOMEN - 1 VIEW COMPARISON:  CT from earlier on the same day FINDINGS: Gastric tube projects within the body of the stomach. Gas-filled mildly distended stomach. Scattered air containing small bowel loops are seen in the left lower quadrant. No free air. Pyelograms are seen from prior contrast administration within both kidneys more so on the right with slight blunting of the calices. Lower lumbar degenerative disc disease  and facet arthropathy. IMPRESSION: Gastric tube in the stomach. Mild gaseous distention of small bowel loops similar to CT findings of possibly representing small bowel dysmotility bold or small bowel obstruction. Electronically Signed   By: Tollie Eth M.D.   On: 04/12/2016 22:53   Ct Abdomen Pelvis W Contrast  Result Date: 04/12/2016 CLINICAL DATA:  Acute onset of generalized abdominal pain and nausea. Lack of bowel movements. Initial encounter. EXAM: CT ABDOMEN AND PELVIS WITH CONTRAST TECHNIQUE: Multidetector CT imaging of the abdomen and pelvis was performed using the standard protocol following bolus administration of intravenous contrast. CONTRAST:  ISOVUE-300 IOPAMIDOL (ISOVUE-300) INJECTION 61% COMPARISON:  CT of the abdomen and pelvis from 05/14/2014 FINDINGS: Lower chest: The visualized lung bases are grossly clear. Scattered coronary artery calcifications are seen. Hepatobiliary: The patient is status post cholecystectomy. There is minimal prominence of the intrahepatic biliary ducts, within normal limits. The liver is unremarkable in appearance. The common bile duct remains normal in caliber. Pancreas: The pancreas is within normal limits. Spleen: The spleen is unremarkable in appearance. Adrenals/Urinary Tract: The adrenal glands are unremarkable in appearance. The kidneys are within normal limits Mild right renal atrophy and scarring is noted, with numerous right renal stones measuring up to 9 mm in size. There is no evidence of hydronephrosis. No obstructing ureteral stones are seen. Minimal nonspecific left-sided perinephric stranding is noted. The left kidney is otherwise unremarkable.  Stomach/Bowel: There is distention of small-bowel loops up to 3.8 cm in diameter, with gradual decompression at the right lower quadrant. No focal transition point or fecalization is seen. This may reflect some degree of small bowel dysmotility. The stomach is partially filled with fluid and air, and is  grossly unremarkable in appearance. The appendix is not well characterized; there is no evidence of appendicitis. The colon is grossly unremarkable in appearance. Vascular/Lymphatic: Scattered calcification is seen along the abdominal aorta and its branches. Minimal mural thrombus is noted along the distal abdominal aorta, without evidence of significant luminal narrowing. The abdominal aorta is otherwise grossly unremarkable. The inferior vena cava is grossly unremarkable. No retroperitoneal lymphadenopathy is seen. No pelvic sidewall lymphadenopathy is identified. Reproductive: The bladder is mildly distended and grossly unremarkable. The patient is status post hysterectomy. The ovaries are relatively symmetric. No suspicious adnexal masses are seen. Other: No additional soft tissue abnormalities are seen. Musculoskeletal: No acute osseous abnormalities are identified. Anterior bridging osteophytes are noted along the lower thoracic spine. Small posterior disc protrusions are seen along the lumbar spine. The visualized musculature is unremarkable in appearance. IMPRESSION: 1. Distention of small-bowel loops up to 3.8 cm in diameter, with gradual decompression at the right lower quadrant. No focal transition point or fecalization seen. This may reflect some degree of small bowel dysmotility, without definite evidence for bowel obstruction at this time. Would follow-up with the patient's symptoms. 2. Mild right renal atrophy and scarring, with numerous right renal stones measuring up to 9 mm in size. 3. Scattered aortic atherosclerosis. Minimal mural thrombus along the distal abdominal aorta, without significant luminal narrowing. 4. Scattered coronary artery calcifications seen. 5. Mild degenerative change along the lower thoracic and lumbar spine. Electronically Signed   By: Roanna RaiderJeffery  Chang M.D.   On: 04/12/2016 17:38   Dg Abd 2 Views  Result Date: 04/13/2016 CLINICAL DATA:  Abdominal pain. EXAM: ABDOMEN - 2  VIEW COMPARISON:  04/12/2016 . FINDINGS: NG tube noted with tip over the upper portion stomach. No bowel distention. Oral contrast in the colon. Low lung volumes with mild basilar atelectasis and infiltrates. IMPRESSION: 1. NG tube noted with its tip below the left hemidiaphragm projected over the upper stomach. No bowel distention. Oral contrast in the colon. 2. Low lung volumes with mild bibasilar atelectasis and infiltrates. Electronically Signed   By: Maisie Fushomas  Register   On: 04/13/2016 08:32   Dg Abdomen Acute W/chest  Result Date: 04/12/2016 CLINICAL DATA:  Epigastric pain. EXAM: DG ABDOMEN ACUTE W/ 1V CHEST COMPARISON:  05/16/2014. FINDINGS: Mediastinum hilar structures normal. Heart size normal. No focal infiltrate. Low lung volumes. Distended loops of small bowel noted. Developing small bowel obstruction cannot be excluded . Colonic gas pattern is normal. Stool in colon. Follow-up abdominal series suggested to demonstrate resolution of small bowel distention. Calcifications in the pelvis consistent phleboliths. IMPRESSION: Dilated loops of small bowel. Developing small bowel obstruction cannot be excluded. Follow-up abdominal series suggested. Electronically Signed   By: Maisie Fushomas  Register   On: 04/12/2016 15:56    Anti-infectives: Anti-infectives    None      Assessment/Plan: Ileus resolving Clamp NGT if No N/V may DC it No surgical intervention at this time Sterling Bigiego Leron Stoffers, MD, [facility]  04/13/2016

## 2016-04-13 NOTE — Progress Notes (Signed)
Inpatient Diabetes Program Recommendations  AACE/ADA: New Consensus Statement on Inpatient Glycemic Control (2015)  Target Ranges:  Prepandial:   less than 140 mg/dL      Peak postprandial:   less than 180 mg/dL (1-2 hours)      Critically ill patients:  140 - 180 mg/dL   Results for Octavio GravesQUIRING, Alexa Abbott (MRN [id number]) as of 04/13/2016 10:33  Ref. Range 04/12/2016 14:53 04/13/2016 06:24  Glucose Latest Ref Range: 65 - 99 mg/dL 409192 (H) 811117 (H)   Review of Glycemic Control  Diabetes history: DM2 Outpatient Diabetes medications: Metformin XR 500 mg BID Current orders for Inpatient glycemic control: None  Inpatient Diabetes Program Recommendations: Correction (SSI): While inpatient, please consider ordering CBGs with Novolog correction scale Q4H (and change to ACHS when diet ordered). HgbA1C: Please consider ordering an A1C to evaluate glycemic control over the past 2-3 months.  Thanks, Alexa PennerMarie Alaira Level, RN, MSN, CDE Diabetes Coordinator Inpatient Diabetes Program [phone] (Team Pager from 8am to 5pm)

## 2016-04-13 NOTE — Progress Notes (Signed)
Patient started to get nauseated shortly after gastic tube was clamped.  Dr Everlene FarrierPabon notified and order given to keep gastric tube

## 2016-04-14 ENCOUNTER — Inpatient Hospital Stay: Payer: Medicare Other

## 2016-04-14 DIAGNOSIS — K567 Ileus, unspecified: Secondary | ICD-10-CM

## 2016-04-14 LAB — BASIC METABOLIC PANEL
Anion gap: 8 (ref 5–15)
BUN: 11 mg/dL (ref 6–20)
CHLORIDE: 104 mmol/L (ref 101–111)
CO2: 30 mmol/L (ref 22–32)
CREATININE: 0.97 mg/dL (ref 0.44–1.00)
Calcium: 8.9 mg/dL (ref 8.9–10.3)
GFR calc Af Amer: >60 mL/min (ref >60)
GFR calc non Af Amer: 59 mL/min — ABNORMAL LOW (ref >60)
Glucose, Bld: 87 mg/dL (ref 65–99)
Potassium: 4.3 mmol/L (ref 3.5–5.1)
SODIUM: 142 mmol/L (ref 135–145)

## 2016-04-14 LAB — CBC
HCT: 39.2 % (ref 35.0–47.0)
Hemoglobin: 13.5 g/dL (ref 12.0–16.0)
MCH: 29.8 pg (ref 26.0–34.0)
MCHC: 34.3 g/dL (ref 32.0–36.0)
MCV: 86.9 fL (ref 80.0–100.0)
PLATELETS: 148 10*3/uL — AB (ref 150–440)
RBC: 4.51 MIL/uL (ref 3.80–5.20)
RDW: 12.9 % (ref 11.5–14.5)
WBC: 5.8 10*3/uL (ref 3.6–11.0)

## 2016-04-14 NOTE — Progress Notes (Signed)
Dr Everlene FarrierPabon gave me orders earlier today to remove the patients gastric tube if she was able to tolerate her clear liquid tray at supper.  Patient was not nauseated after supper so NG was removed

## 2016-04-14 NOTE — Progress Notes (Signed)
Dr Everlene FarrierPabon gave order earlier to change diet to clear liquid for supper if patient tolerating prn liquids.  Patient has not verbalized any nausea

## 2016-04-14 NOTE — Progress Notes (Signed)
CC: ileus   Subjective: Did not tolerate NGT clamping yesterday but feels better and had a BM Abd pain improved KUB personally reviewed resolved ileus  Objective: Vital signs in last 24 hours: Temp:  [97.6 F (36.4 C)-98 F (36.7 C)] 97.7 F (36.5 C) (02/15 0849) Pulse Rate:  [78-95] 86 (02/15 0849) Resp:  [16-20] 16 (02/15 0849) BP: (128-148)/(50-65) 148/56 (02/15 0849) SpO2:  [90 %-95 %] 95 % (02/15 0849) Last BM Date: 04/14/16  Intake/Output from previous day: 02/14 0701 - 02/15 0700 In: 3366.8 [I.V.:3366.8] Out: 2475 [Urine:1800; Emesis/NG output:675] Intake/Output this shift: No intake/output data recorded.  Physical exam: NAD Chest CTA, NSR no murmurs Abd: soft, NT , no peritonitis Ext: well perfused, no edema   Lab Results: CBC   Recent Labs  04/13/16 0624 04/14/16 0454  WBC 7.4 5.8  HGB 13.7 13.5  HCT 38.8 39.2  PLT 163 148*   BMET  Recent Labs  04/13/16 0624 04/14/16 0454  NA 139 142  K 4.9 4.3  CL 105 104  CO2 28 30  GLUCOSE 117* 87  BUN 14 11  CREATININE 1.27* 0.97  CALCIUM 9.6 8.9   PT/INR No results for input(s): LABPROT, INR in the last 72 hours. ABG No results for input(s): PHART, HCO3 in the last 72 hours.  Invalid input(s): PCO2, PO2  Studies/Results: Dg Abdomen 1 View  Result Date: 04/12/2016 CLINICAL DATA:  NG tube placement EXAM: ABDOMEN - 1 VIEW COMPARISON:  CT from earlier on the same day FINDINGS: Gastric tube projects within the body of the stomach. Gas-filled mildly distended stomach. Scattered air containing small bowel loops are seen in the left lower quadrant. No free air. Pyelograms are seen from prior contrast administration within both kidneys more so on the right with slight blunting of the calices. Lower lumbar degenerative disc disease and facet arthropathy. IMPRESSION: Gastric tube in the stomach. Mild gaseous distention of small bowel loops similar to CT findings of possibly representing small bowel dysmotility  bold or small bowel obstruction. Electronically Signed   By: Tollie Ethavid  Kwon M.D.   On: 04/12/2016 22:53   Ct Abdomen Pelvis W Contrast  Result Date: 04/12/2016 CLINICAL DATA:  Acute onset of generalized abdominal pain and nausea. Lack of bowel movements. Initial encounter. EXAM: CT ABDOMEN AND PELVIS WITH CONTRAST TECHNIQUE: Multidetector CT imaging of the abdomen and pelvis was performed using the standard protocol following bolus administration of intravenous contrast. CONTRAST:  100mL ISOVUE-300 IOPAMIDOL (ISOVUE-300) INJECTION 61% COMPARISON:  CT of the abdomen and pelvis from 05/14/2014 FINDINGS: Lower chest: The visualized lung bases are grossly clear. Scattered coronary artery calcifications are seen. Hepatobiliary: The patient is status post cholecystectomy. There is minimal prominence of the intrahepatic biliary ducts, within normal limits. The liver is unremarkable in appearance. The common bile duct remains normal in caliber. Pancreas: The pancreas is within normal limits. Spleen: The spleen is unremarkable in appearance. Adrenals/Urinary Tract: The adrenal glands are unremarkable in appearance. The kidneys are within normal limits Mild right renal atrophy and scarring is noted, with numerous right renal stones measuring up to 9 mm in size. There is no evidence of hydronephrosis. No obstructing ureteral stones are seen. Minimal nonspecific left-sided perinephric stranding is noted. The left kidney is otherwise unremarkable. Stomach/Bowel: There is distention of small-bowel loops up to 3.8 cm in diameter, with gradual decompression at the right lower quadrant. No focal transition point or fecalization is seen. This may reflect some degree of small bowel dysmotility. The stomach is partially  filled with fluid and air, and is grossly unremarkable in appearance. The appendix is not well characterized; there is no evidence of appendicitis. The colon is grossly unremarkable in appearance. Vascular/Lymphatic:  Scattered calcification is seen along the abdominal aorta and its branches. Minimal mural thrombus is noted along the distal abdominal aorta, without evidence of significant luminal narrowing. The abdominal aorta is otherwise grossly unremarkable. The inferior vena cava is grossly unremarkable. No retroperitoneal lymphadenopathy is seen. No pelvic sidewall lymphadenopathy is identified. Reproductive: The bladder is mildly distended and grossly unremarkable. The patient is status post hysterectomy. The ovaries are relatively symmetric. No suspicious adnexal masses are seen. Other: No additional soft tissue abnormalities are seen. Musculoskeletal: No acute osseous abnormalities are identified. Anterior bridging osteophytes are noted along the lower thoracic spine. Small posterior disc protrusions are seen along the lumbar spine. The visualized musculature is unremarkable in appearance. IMPRESSION: 1. Distention of small-bowel loops up to 3.8 cm in diameter, with gradual decompression at the right lower quadrant. No focal transition point or fecalization seen. This may reflect some degree of small bowel dysmotility, without definite evidence for bowel obstruction at this time. Would follow-up with the patient's symptoms. 2. Mild right renal atrophy and scarring, with numerous right renal stones measuring up to 9 mm in size. 3. Scattered aortic atherosclerosis. Minimal mural thrombus along the distal abdominal aorta, without significant luminal narrowing. 4. Scattered coronary artery calcifications seen. 5. Mild degenerative change along the lower thoracic and lumbar spine. Electronically Signed   By: Roanna Raider M.D.   On: 04/12/2016 17:38   Dg Abd 2 Views  Result Date: 04/13/2016 CLINICAL DATA:  Abdominal pain. EXAM: ABDOMEN - 2 VIEW COMPARISON:  04/12/2016 . FINDINGS: NG tube noted with tip over the upper portion stomach. No bowel distention. Oral contrast in the colon. Low lung volumes with mild basilar  atelectasis and infiltrates. IMPRESSION: 1. NG tube noted with its tip below the left hemidiaphragm projected over the upper stomach. No bowel distention. Oral contrast in the colon. 2. Low lung volumes with mild bibasilar atelectasis and infiltrates. Electronically Signed   By: Maisie Fus  Register   On: 04/13/2016 08:32   Dg Abd Acute W/chest  Result Date: 04/14/2016 CLINICAL DATA:  Ileus EXAM: DG ABDOMEN ACUTE W/ 1V CHEST COMPARISON:  04/13/2016 FINDINGS: Normal heart size.  Clear lungs. NG tube tip is in the fundus of the stomach. There is no free intraperitoneal gas. There are no disproportionally dilated loops of bowel. There is enteric contrast in the colon. No air-fluid levels. Pelvic phleboliths. IMPRESSION: No active cardiopulmonary disease. Nonobstructive bowel gas pattern. Electronically Signed   By: Jolaine Click M.D.   On: 04/14/2016 08:35   Dg Abdomen Acute W/chest  Result Date: 04/12/2016 CLINICAL DATA:  Epigastric pain. EXAM: DG ABDOMEN ACUTE W/ 1V CHEST COMPARISON:  05/16/2014. FINDINGS: Mediastinum hilar structures normal. Heart size normal. No focal infiltrate. Low lung volumes. Distended loops of small bowel noted. Developing small bowel obstruction cannot be excluded . Colonic gas pattern is normal. Stool in colon. Follow-up abdominal series suggested to demonstrate resolution of small bowel distention. Calcifications in the pelvis consistent phleboliths. IMPRESSION: Dilated loops of small bowel. Developing small bowel obstruction cannot be excluded. Follow-up abdominal series suggested. Electronically Signed   By: Maisie Fus  Register   On: 04/12/2016 15:56    Anti-infectives: Anti-infectives    None      Assessment/Plan: Ileus responding to medical rx Dc NGT Start clears  This pm Ambulate Likely DC in am  Sterling Big, MD, [facility]  04/14/2016

## 2016-04-15 NOTE — Discharge Summary (Signed)
Patient ID: Alexa Abbott MRN: [id number] DOB/AGE: [birth year] 66 y.o.  Admit date: 04/12/2016 Discharge date: 04/15/2016   Discharge Diagnoses:  Active Problems:   SBO (small bowel obstruction)   Small bowel obstruction   Ileus [facility])    Hospital Course: 66 year old female admitted with abdominal pain nausea and vomiting CT scan showing evidence of bowel obstruction. She was admitted and an NG tube was placed and was hydrated. Her symptoms did improve and she started to pass some gas and her NG tube output dropped. We were able to remove the NG and slowly advance her diet from a clear liquid diet to a regular diet. At the time of discharge she was ambulating, she was pain-free her vital signs were stable and she was afebrile. Her physical exam at the time of discharge showed a female in no acute distress, awake and alert. Abdomen: Soft, nontender without peritonitis. Extremities: Soft and well perfused. No edema. Condition at the time of discharge is stable   Disposition: Home  Discharge Instructions    Call MD for:  difficulty breathing, headache or visual disturbances    Complete by:  As directed    Call MD for:  extreme fatigue    Complete by:  As directed    Call MD for:  hives    Complete by:  As directed    Call MD for:  persistant dizziness or light-headedness    Complete by:  As directed    Call MD for:  persistant nausea and vomiting    Complete by:  As directed    Call MD for:  redness, tenderness, or signs of infection (pain, swelling, redness, odor or green/yellow discharge around incision site)    Complete by:  As directed    Call MD for:  severe uncontrolled pain    Complete by:  As directed    Call MD for:  temperature >100.4    Complete by:  As directed    Diet - low sodium heart healthy    Complete by:  As directed    Increase activity slowly    Complete by:  As directed      Allergies as of 04/15/2016      Reactions   Adhesive [tape]    Codeine Itching    Penicillins Rash   Has patient had a PCN reaction causing immediate rash, facial/tongue/throat swelling, SOB or lightheadedness with hypotension: yes Has patient had a PCN reaction causing severe rash involving mucus membranes or skin necrosis: no Has patient had a PCN reaction that required hospitalization no Has patient had a PCN reaction occurring within the last 10 years: no If all of the above answers are "NO", then may proceed with Cephalosporin use.      Medication List    TAKE these medications   acetaminophen 500 MG tablet Commonly known as:  TYLENOL Take 1,000 mg by mouth every 6 (six) hours as needed.   loratadine 10 MG tablet Commonly known as:  CLARITIN Take 10 mg by mouth daily.   metFORMIN 500 MG 24 hr tablet Commonly known as:  GLUCOPHAGE-XR Take 500 mg by mouth 2 (two) times daily.   PROBIOTIC ACIDOPHILUS PO Take 1 tablet by mouth daily.   ranitidine 150 MG tablet Commonly known as:  ZANTAC Take 150 mg by mouth 2 (two) times daily.      Follow-up Information    Leafy Ro, MD Follow up in 2 week(s).   Specialty:  General Surgery Contact information: [address]  [address] [place] [address] [phone]            Sterling Bigiego Pabon, MD FACS

## 2016-04-15 NOTE — Progress Notes (Signed)
Pt A and O x 4. VSS. Pt tolerating diet well. No complaints of pain or nausea. IV removed intact, no new prescriptions given. Pt voiced understanding of discharge instructions with no further questions. Pt discharged via wheelchair with axillary.   

## 2016-04-27 ENCOUNTER — Other Ambulatory Visit: Payer: Self-pay

## 2016-05-02 ENCOUNTER — Ambulatory Visit (INDEPENDENT_AMBULATORY_CARE_PROVIDER_SITE_OTHER): Payer: Medicare Other | Admitting: Surgery

## 2016-05-02 ENCOUNTER — Encounter: Payer: Self-pay | Admitting: Surgery

## 2016-05-02 VITALS — BP 133/76 | HR 92 | Temp 98.3°F | Ht 63.0 in | Wt 192.0 lb

## 2016-05-02 DIAGNOSIS — K56609 Unspecified intestinal obstruction, unspecified as to partial versus complete obstruction: Secondary | ICD-10-CM

## 2016-05-02 NOTE — Progress Notes (Signed)
Alexa Abbott is f/u after recent episode SBO treated medically She feels well No N/V Taking PO and having BM No abd pain  PE NAD, awake alert Abd: soft, NT, no peritonitis, ND Ext: well perfused, no edema  A/P Doing well encourage fiber prn laxatives No surgical intervention

## 2016-05-02 NOTE — Patient Instructions (Signed)
Please try to eat healthier foods. I also want you to chew your food as much as you can before swallowing it.  Please have your colonoscopy done as planned by Dr. Mechele CollinElliott.  Continue taking your Metamucil daily or switch to Miralax 17 G daily so you could have a regular bowel movement.  Please give us a call if you have any questions or concerns.

## 2016-05-24 ENCOUNTER — Other Ambulatory Visit: Payer: Self-pay | Admitting: Family Medicine

## 2016-05-24 DIAGNOSIS — Z1239 Encounter for other screening for malignant neoplasm of breast: Secondary | ICD-10-CM

## 2016-06-22 ENCOUNTER — Ambulatory Visit
Admission: RE | Admit: 2016-06-22 | Discharge: 2016-06-22 | Disposition: A | Discharge status: Home / Self Care | Payer: Medicare Other | Source: Ambulatory Visit | Attending: Family Medicine | Admitting: Family Medicine

## 2016-06-22 DIAGNOSIS — Z1231 Encounter for screening mammogram for malignant neoplasm of breast: Secondary | ICD-10-CM | POA: Diagnosis present

## 2016-06-22 DIAGNOSIS — Z1239 Encounter for other screening for malignant neoplasm of breast: Secondary | ICD-10-CM

## 2017-03-31 ENCOUNTER — Emergency Department: Payer: Medicare Other

## 2017-03-31 ENCOUNTER — Encounter: Payer: Self-pay | Admitting: *Deleted

## 2017-03-31 ENCOUNTER — Emergency Department
Admission: EM | Admit: 2017-03-31 | Discharge: 2017-03-31 | Disposition: A | Discharge status: Home / Self Care | Payer: Medicare Other | Attending: Emergency Medicine | Admitting: Emergency Medicine

## 2017-03-31 ENCOUNTER — Other Ambulatory Visit: Payer: Self-pay

## 2017-03-31 DIAGNOSIS — Z87891 Personal history of nicotine dependence: Secondary | ICD-10-CM | POA: Diagnosis not present

## 2017-03-31 DIAGNOSIS — Z7984 Long term (current) use of oral hypoglycemic drugs: Secondary | ICD-10-CM | POA: Insufficient documentation

## 2017-03-31 DIAGNOSIS — K589 Irritable bowel syndrome without diarrhea: Secondary | ICD-10-CM | POA: Diagnosis not present

## 2017-03-31 DIAGNOSIS — R110 Nausea: Secondary | ICD-10-CM | POA: Diagnosis not present

## 2017-03-31 DIAGNOSIS — Z880 Allergy status to penicillin: Secondary | ICD-10-CM | POA: Diagnosis not present

## 2017-03-31 DIAGNOSIS — J45909 Unspecified asthma, uncomplicated: Secondary | ICD-10-CM | POA: Diagnosis not present

## 2017-03-31 DIAGNOSIS — E119 Type 2 diabetes mellitus without complications: Secondary | ICD-10-CM | POA: Diagnosis not present

## 2017-03-31 DIAGNOSIS — R1011 Right upper quadrant pain: Secondary | ICD-10-CM | POA: Diagnosis not present

## 2017-03-31 DIAGNOSIS — Z885 Allergy status to narcotic agent status: Secondary | ICD-10-CM | POA: Diagnosis not present

## 2017-03-31 DIAGNOSIS — R1010 Upper abdominal pain, unspecified: Secondary | ICD-10-CM | POA: Insufficient documentation

## 2017-03-31 HISTORY — DX: Unspecified asthma, uncomplicated: J45.909

## 2017-03-31 LAB — CBC
HCT: 47.5 % — ABNORMAL HIGH (ref 35.0–47.0)
Hemoglobin: 15.9 g/dL (ref 12.0–16.0)
MCH: 29.6 pg (ref 26.0–34.0)
MCHC: 33.5 g/dL (ref 32.0–36.0)
MCV: 88.4 fL (ref 80.0–100.0)
PLATELETS: 291 10*3/uL (ref 150–440)
RBC: 5.37 MIL/uL — AB (ref 3.80–5.20)
RDW: 13.2 % (ref 11.5–14.5)
WBC: 13.2 10*3/uL — AB (ref 3.6–11.0)

## 2017-03-31 LAB — COMPREHENSIVE METABOLIC PANEL
ALK PHOS: 86 U/L (ref 38–126)
ALT: 18 U/L (ref 14–54)
AST: 26 U/L (ref 15–41)
Albumin: 4.1 g/dL (ref 3.5–5.0)
Anion gap: 12 (ref 5–15)
BUN: 20 mg/dL (ref 6–20)
CALCIUM: 9.9 mg/dL (ref 8.9–10.3)
CHLORIDE: 106 mmol/L (ref 101–111)
CO2: 23 mmol/L (ref 22–32)
CREATININE: 1.02 mg/dL — AB (ref 0.44–1.00)
GFR, EST NON AFRICAN AMERICAN: 55 mL/min — AB (ref >60)
Glucose, Bld: 119 mg/dL — ABNORMAL HIGH (ref 65–99)
Potassium: 4.1 mmol/L (ref 3.5–5.1)
Sodium: 141 mmol/L (ref 135–145)
Total Bilirubin: 0.8 mg/dL (ref 0.3–1.2)
Total Protein: 7.6 g/dL (ref 6.5–8.1)

## 2017-03-31 LAB — URINALYSIS, COMPLETE (UACMP) WITH MICROSCOPIC
Bacteria, UA: NONE SEEN
Bilirubin Urine: NEGATIVE
GLUCOSE, UA: NEGATIVE mg/dL
Hgb urine dipstick: NEGATIVE
Ketones, ur: NEGATIVE mg/dL
Nitrite: NEGATIVE
PH: 5 (ref 5.0–8.0)
Protein, ur: NEGATIVE mg/dL
Specific Gravity, Urine: 1.016 (ref 1.005–1.030)

## 2017-03-31 LAB — LACTIC ACID, PLASMA
LACTIC ACID, VENOUS: 1.7 mmol/L (ref 0.5–1.9)
Lactic Acid, Venous: 2.3 mmol/L (ref 0.5–1.9)

## 2017-03-31 LAB — LIPASE, BLOOD: Lipase: 68 U/L — ABNORMAL HIGH (ref 11–51)

## 2017-03-31 MED ORDER — SODIUM CHLORIDE 0.9 % IV BOLUS (SEPSIS)
1000.0000 mL | Freq: Once | INTRAVENOUS | Status: AC
  Administered 2017-03-31: 1000 mL via INTRAVENOUS

## 2017-03-31 MED ORDER — IOPAMIDOL (ISOVUE-300) INJECTION 61%
100.0000 mL | Freq: Once | INTRAVENOUS | Status: AC | PRN
  Administered 2017-03-31: 100 mL via INTRAVENOUS

## 2017-03-31 MED ORDER — IOPAMIDOL (ISOVUE-300) INJECTION 61%: 30.0000 mL | Freq: Once | INTRAVENOUS | Status: DC | PRN

## 2017-03-31 MED ORDER — DICYCLOMINE HCL 10 MG PO CAPS
10.0000 mg | ORAL_CAPSULE | Freq: Once | ORAL | Status: AC
  Administered 2017-03-31: 10 mg via ORAL
  Filled 2017-03-31: qty 1

## 2017-03-31 MED ORDER — KETOROLAC TROMETHAMINE 30 MG/ML IJ SOLN
15.0000 mg | Freq: Once | INTRAMUSCULAR | Status: DC
  Filled 2017-03-31: qty 1

## 2017-03-31 MED ORDER — ONDANSETRON HCL 4 MG/2ML IJ SOLN
4.0000 mg | Freq: Once | INTRAMUSCULAR | Status: AC
  Administered 2017-03-31: 4 mg via INTRAVENOUS
  Filled 2017-03-31: qty 2

## 2017-03-31 MED ORDER — ONDANSETRON 4 MG PO TBDP: 4.0000 mg | ORAL_TABLET | Freq: Three times a day (TID) | ORAL | 0 refills | Status: DC | PRN

## 2017-03-31 NOTE — Discharge Instructions (Signed)

## 2017-03-31 NOTE — ED Notes (Signed)

## 2017-03-31 NOTE — ED Triage Notes (Addendum)
Patient c/o RLQ pain, constant nausea, and last BM was a small one yesterday. Patient states she usually has 8 BMs /day due to IBS. Patient states her symptoms are consistent with a small bowel obstruction which she has had before. Patient states she had a cortisone epidural in her back 03/18/2017 as a regularly scheduled procedure.

## 2017-03-31 NOTE — ED Provider Notes (Addendum)
[facility] Emergency Department Provider Note  ____________________________________________  Time seen: Approximately 5:21 PM  I have reviewed the triage vital signs and the nursing notes.   HISTORY  Chief Complaint Abdominal Pain   HPI Alexa Abbott is a 67 y.o. female the history of several prior abdominal surgeries and SBO who presents for evaluation of abdominal pain. Patient reports 2 days of progressively worsening cramping upper abdominal pain that became severe this morning. She has had severe nausea but no vomiting. Her last bowel movement was yesterday however patient tells me that she usually has about 8 bowel movements a day which are watery due to IBS. No bowel movements today. No fever but has had chills. No dysuria or hematuria. Patient reports her symptoms are similar to her prior SBO. No chest pain or shortness of breath, no URI symptoms. She is passing flatus. She called her surgeon's office and was told that he was on call at the hospital so she decided to come in to be evaluated. At this time her pain is 6 out of 10.  Past Medical History:  Diagnosis Date  . Abdominal pain, generalized   . Anemia   . Asthma   . DDD (degenerative disc disease), lumbar 10/09/2014  . Diabetes (HCC) 07/25/2013  . Diabetes mellitus without complication (HCC)   . Hyperlipidemia   . 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 IBS (irritable bowel syndrome)   . Ileus (HCC)   . 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 Kidney stone   . Low urine output 06/06/2012  . Numbness in right leg 10/09/2014  . Plantar fasciitis   . Renal insufficiency   . Sacroiliac joint pain 10/22/2015  . SBO (small bowel obstruction) (HCC)   . 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 Type 2 diabetes mellitus [facility])     Patient Active Problem List   Diagnosis Date Noted  . Ileus (HCC)   . Small bowel obstruction (HCC) 04/13/2016   . Abdominal pain, generalized   . SBO (small bowel obstruction) (HCC)   . Sacroiliac joint pain 10/22/2015  . 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 DDD (degenerative disc disease), lumbar 10/09/2014  . Numbness in right leg 10/09/2014  . Diabetes (HCC) 07/25/2013  . Kidney stone 06/06/2012  . 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 Low urine output 06/06/2012    Past Surgical History:  Procedure Laterality Date  . ABDOMINAL HYSTERECTOMY    . APPENDECTOMY    . CHOLECYSTECTOMY    . COLONOSCOPY    . MICRODISCECTOMY LUMBAR  2016    Prior to Admission medications   Medication Sig Start Date End Date Taking? Authorizing Provider  acetaminophen (TYLENOL) 500 MG tablet Take 1,000 mg by mouth every 6 (six) hours as needed.    [provider]  BAYER CONTOUR TEST test strip TEST ONCE D UTD 01/19/16   [provider]  Lactobacillus (PROBIOTIC ACIDOPHILUS PO) Take 1 tablet by mouth daily.    [provider]  loratadine (CLARITIN) 10 MG tablet Take 10 mg by mouth daily.    [provider]  metFORMIN (GLUCOPHAGE-XR) 500 MG 24 hr tablet Take 500 mg by mouth 2 (two) times daily. 12/11/15   [provider]  ondansetron (ZOFRAN ODT) 4 MG disintegrating tablet Take 1 tablet (4 mg total) by mouth every  8 (eight) hours as needed for nausea or vomiting. 03/31/17   Nita SickleVeronese, [place], MD  ranitidine (ZANTAC) 150 MG tablet Take 150 mg by mouth 2 (two) times daily. 01/28/16   [provider]    Allergies Adhesive [tape]; Codeine; and Penicillins  Family History  Problem Relation Age of Onset  . Diabetes Mother   . Hypertension Mother   . Stroke Mother   . Crohn's disease Mother   . Throat cancer Father   . Hypertension Father   . Diabetes Father   . Lung cancer Sister   . Cancer Sister        lung, liver,  bone  . Anesthesia problems Daughter   . Diabetes Maternal Grandmother     Social History Social History   Tobacco Use  . Smoking status: Former Smoker    Last attempt to quit: 1998    Years since quitting: 21.1  . Smokeless tobacco: Never Used  Substance Use Topics  . Alcohol use: Yes    Comment: occasionally  . Drug use: No    Review of Systems  Constitutional: Negative for fever. Eyes: Negative for visual changes. ENT: Negative for sore throat. Neck: No neck pain  Cardiovascular: Negative for chest pain. Respiratory: Negative for shortness of breath. Gastrointestinal: + upper abdominal pain and nausea. No vomiting or diarrhea or constipation Genitourinary: Negative for dysuria. Musculoskeletal: Negative for back pain. Skin: Negative for rash. Neurological: Negative for headaches, weakness or numbness. Psych: No SI or HI  ____________________________________________   PHYSICAL EXAM:  VITAL SIGNS: ED Triage Vitals  Enc Vitals Group     BP 03/31/17 1259 (!) 138/93     Pulse Rate 03/31/17 1259 76     Resp 03/31/17 1259 18     Temp 03/31/17 1259 97.8 F (36.6 C)     Temp Source 03/31/17 1259 Oral     SpO2 03/31/17 1259 96 %     Weight 03/31/17 1300 192 lb (87.1 kg)     Height 03/31/17 1300 5' (1.524 m)     Head Circumference --      Peak Flow --      Pain Score 03/31/17 1300 5     Pain Loc --      Pain Edu? --      Excl. in GC? --     Constitutional: Alert and oriented. Well appearing and in no apparent distress. HEENT:      Head: Normocephalic and atraumatic.         Eyes: Conjunctivae are normal. Sclera is non-icteric.       Mouth/Throat: Mucous membranes are moist.       Neck: Supple with no signs of meningismus. Cardiovascular: Regular rate and rhythm. No murmurs, gallops, or rubs. 2+ symmetrical distal pulses are present in all extremities. No JVD. Respiratory: Normal respiratory effort. Lungs are clear to auscultation bilaterally. No wheezes,  crackles, or rhonchi.  Gastrointestinal: Soft, non tender, and non distended with positive bowel sounds. No rebound or guarding. Musculoskeletal: Nontender with normal range of motion in all extremities. No edema, cyanosis, or erythema of extremities. Neurologic: Normal speech and language. Face is symmetric. Moving all extremities. No gross focal neurologic deficits are appreciated. Skin: Skin is warm, dry and intact. No rash noted. Psychiatric: Mood and affect are normal. Speech and behavior are normal.  ____________________________________________   LABS (all labs ordered are listed, but only abnormal results are displayed)  Labs Reviewed  COMPREHENSIVE METABOLIC PANEL - Abnormal; Notable for the following components:  Result Value   Glucose, Bld 119 (*)    Creatinine, Ser 1.02 (*)    GFR calc non Af Amer 55 (*)    All other components within normal limits  CBC - Abnormal; Notable for the following components:   WBC 13.2 (*)    RBC 5.37 (*)    HCT 47.5 (*)    All other components within normal limits  URINALYSIS, COMPLETE (UACMP) WITH MICROSCOPIC - Abnormal; Notable for the following components:   Color, Urine COLORLESS (*)    APPearance CLEAR (*)    Leukocytes, UA SMALL (*)    Squamous Epithelial / LPF 0-5 (*)    All other components within normal limits  LIPASE, BLOOD - Abnormal; Notable for the following components:   Lipase 68 (*)    All other components within normal limits  LACTIC ACID, PLASMA - Abnormal; Notable for the following components:   Lactic Acid, Venous 2.3 (*)    All other components within normal limits  URINE CULTURE  LACTIC ACID, PLASMA   ____________________________________________  EKG  ED ECG REPORT I, Nita Sickle, the attending physician, personally viewed and interpreted this ECG.  Normal sinus rhythm, rate of 69, normal intervals, left axis deviation, no ST elevations or depressions.   ____________________________________________  RADIOLOGY  Interpreted by me:  CT a/p: Stomach appears distended but no evidence of obstruction. Renal stones but no ureteral stones, normal pancreas.   Interpretation by Radiologist:  Ct Abdomen Pelvis W Contrast  Result Date: 03/31/2017 CLINICAL DATA:  Right lower quadrant abdominal pain and nausea. History of prior small bowel obstructions. EXAM: CT ABDOMEN AND PELVIS WITH CONTRAST TECHNIQUE: Multidetector CT imaging of the abdomen and pelvis was performed using the standard protocol following bolus administration of intravenous contrast. CONTRAST:  ISOVUE-300 IOPAMIDOL (ISOVUE-300) INJECTION 61% COMPARISON:  CT scan 04/12/2016 FINDINGS: Lower chest: The lung bases are clear of an acute process. No pleural effusion or worrisome pulmonary lesions. The heart is upper limits of normal in size and stable. No pericardial effusion. Stable bilateral subcarinal lymph nodes. Hepatobiliary: No focal hepatic lesions or intrahepatic biliary dilatation. The gallbladder is surgically absent. No common bile duct dilatation. Pancreas: No mass, inflammation or ductal dilatation. Spleen: Normal size.  No focal lesions. Adrenals/Urinary Tract: The adrenal glands are normal and stable. Stable small scarred right kidney with numerous renal calculi. No obstructing ureteral calculi or bladder calculi. No left-sided renal calculi. Both kidneys demonstrate normal enhancement. No worrisome lesions. The delayed images do not demonstrate any significant collecting system abnormalities. No bladder lesions. Stomach/Bowel: The stomach, duodenum, small bowel and colon are unremarkable. No acute inflammatory changes, mass lesions or obstructive findings. The terminal ileum is normal. The appendix is surgically absent. Moderate stool throughout the colon but no acute inflammatory changes or mass lesions. Moderate sigmoid diverticulosis. Vascular/Lymphatic: Moderate atherosclerotic  calcifications involving the aorta and iliac arteries. No aneurysm. The branch vessels are patent. The major venous structures are patent. Reproductive: The uterus is surgically absent. Both ovaries are still present and appear normal. Other: No pelvic mass or adenopathy. No free pelvic fluid collections. No inguinal mass or adenopathy. No abdominal wall hernia or subcutaneous lesions. Musculoskeletal: No significant bony findings. IMPRESSION: 1. No acute abdominal/pelvic findings, mass lesions or adenopathy. 2. Stable small scarred right kidney with numerous renal calculi but no obstructing ureteral calculi or bladder calculi. Status post cholecystectomy. No biliary dilatation. 3. Status post hysterectomy. Both ovaries are still present and appear normal. Electronically Signed   By: P.  Gallerani M.D.   On: 03/31/2017 15:03    ____________________________________________   PROCEDURES  Procedure(s) performed: None Procedures Critical Care performed:  None ____________________________________________   INITIAL IMPRESSION / ASSESSMENT AND PLAN / ED COURSE   67 y.o. female the history of several prior abdominal surgeries and SBO who presents for evaluation of intermittent upper abdominal pain and nausea. Clinically patient does not seem obstructed with soft abdomen, positive bowel sounds, no tenderness at this time. She is passing flatus, she has had no vomiting. On CT her stomach does look slightly distended which prompted me to consult Dr. Everlene Farrier, who is patient's surgeon. He look at the CT and agrees with that finding and thinks is most likely delayed gastric emptying but no evidence of obstruction or any other acute findings. We'll give IV fluids, Zofran and Bentyl, we'll give her a dose of Toradol. Her labs initially showed mildly elevated lactic however repeat lactic with no IVF showed normal lactate. Maybe lab error of tourniquet was applied for too long when lactic was collected.  UA clean. CMP  WNL. WBC slightly elevated at 13.2 however patient recently received steroid injection a few days ago. Lipase is slightly elevated with normal pancreas on CT. This could be mild pancreatitis. Will reassess after IVF.    _________________________ 6:14 PM on 03/31/2017 -----------------------------------------  Patient feels markedly improved. Had a large bowel movement in the emergency room with full resolution of her symptoms. Her abdomen remains soft with no tenderness. At this time patient is stable for discharge home and she will follow up with her primary care doctor. No provide her with a prescription for Zofran. Discussed return precautions.   As part of my medical decision making, I reviewed the following data within the electronic MEDICAL RECORD NUMBER Nursing notes reviewed and incorporated, Labs reviewed , Old chart reviewed, Radiograph reviewed , A consult was requested and obtained from this/these consultant(s) Surgery, Notes from prior ED visits and [place] Controlled Substance Database    Pertinent labs & imaging results that were available during my care of the patient were reviewed by me and considered in my medical decision making (see chart for details).    ____________________________________________   FINAL CLINICAL IMPRESSION(S) / ED DIAGNOSES  Final diagnoses:  Pain of upper abdomen  Nausea      NEW MEDICATIONS STARTED DURING THIS VISIT:  ED Discharge Orders        Ordered    ondansetron (ZOFRAN ODT) 4 MG disintegrating tablet  Every 8 hours PRN     03/31/17 1813       Note:  This document was prepared using Dragon voice recognition software and may include unintentional dictation errors.    Don Perking, [place], MD 04/01/17 [id number]    Nita Sickle, MD 04/01/17 [phone]

## 2017-03-31 NOTE — ED Notes (Signed)
Lactic acid 2.3 MD aware.

## 2017-04-01 LAB — URINE CULTURE

## 2017-06-13 DIAGNOSIS — Z9889 Other specified postprocedural states: Secondary | ICD-10-CM | POA: Insufficient documentation

## 2017-06-13 DIAGNOSIS — M5417 Radiculopathy, lumbosacral region: Secondary | ICD-10-CM | POA: Insufficient documentation

## 2017-07-12 DIAGNOSIS — K580 Irritable bowel syndrome with diarrhea: Secondary | ICD-10-CM | POA: Insufficient documentation

## 2017-07-18 ENCOUNTER — Other Ambulatory Visit: Payer: Self-pay | Admitting: Family Medicine

## 2017-07-18 DIAGNOSIS — Z1239 Encounter for other screening for malignant neoplasm of breast: Secondary | ICD-10-CM

## 2017-08-01 ENCOUNTER — Other Ambulatory Visit
Admission: RE | Admit: 2017-08-01 | Discharge: 2017-08-01 | Disposition: A | Discharge status: Home / Self Care | Payer: Medicare Other | Source: Ambulatory Visit | Attending: Student | Admitting: Student

## 2017-08-01 DIAGNOSIS — K529 Noninfective gastroenteritis and colitis, unspecified: Secondary | ICD-10-CM | POA: Insufficient documentation

## 2017-08-01 LAB — GASTROINTESTINAL PANEL BY PCR, STOOL (REPLACES STOOL CULTURE)

## 2017-08-01 LAB — C DIFFICILE QUICK SCREEN W PCR REFLEX
C Diff antigen: NEGATIVE
C Diff interpretation: NOT DETECTED
C Diff toxin: NEGATIVE

## 2017-08-10 ENCOUNTER — Inpatient Hospital Stay: Payer: Medicare Other

## 2017-08-10 ENCOUNTER — Encounter: Payer: Self-pay | Admitting: Emergency Medicine

## 2017-08-10 ENCOUNTER — Emergency Department: Payer: Medicare Other

## 2017-08-10 ENCOUNTER — Inpatient Hospital Stay
Admission: EM | Admit: 2017-08-10 | Discharge: 2017-08-16 | DRG: 336 | Disposition: A | Discharge status: Home / Self Care | Payer: Medicare Other | Attending: Surgery | Admitting: Surgery

## 2017-08-10 ENCOUNTER — Ambulatory Visit
Admission: RE | Admit: 2017-08-10 | Discharge: 2017-08-10 | Disposition: A | Discharge status: Home / Self Care | Payer: Medicare Other | Source: Ambulatory Visit | Attending: Family Medicine | Admitting: Family Medicine

## 2017-08-10 DIAGNOSIS — K5652 Intestinal adhesions [bands] with complete obstruction: Secondary | ICD-10-CM | POA: Diagnosis not present

## 2017-08-10 DIAGNOSIS — K56609 Unspecified intestinal obstruction, unspecified as to partial versus complete obstruction: Secondary | ICD-10-CM | POA: Diagnosis present

## 2017-08-10 DIAGNOSIS — J45909 Unspecified asthma, uncomplicated: Secondary | ICD-10-CM | POA: Diagnosis present

## 2017-08-10 DIAGNOSIS — K5651 Intestinal adhesions [bands], with partial obstruction: Secondary | ICD-10-CM | POA: Diagnosis present

## 2017-08-10 DIAGNOSIS — Z880 Allergy status to penicillin: Secondary | ICD-10-CM | POA: Diagnosis not present

## 2017-08-10 DIAGNOSIS — Z1231 Encounter for screening mammogram for malignant neoplasm of breast: Secondary | ICD-10-CM

## 2017-08-10 DIAGNOSIS — Z885 Allergy status to narcotic agent status: Secondary | ICD-10-CM

## 2017-08-10 DIAGNOSIS — E669 Obesity, unspecified: Secondary | ICD-10-CM | POA: Diagnosis present

## 2017-08-10 DIAGNOSIS — Z7984 Long term (current) use of oral hypoglycemic drugs: Secondary | ICD-10-CM | POA: Diagnosis not present

## 2017-08-10 DIAGNOSIS — Z1239 Encounter for other screening for malignant neoplasm of breast: Secondary | ICD-10-CM

## 2017-08-10 DIAGNOSIS — E119 Type 2 diabetes mellitus without complications: Secondary | ICD-10-CM | POA: Diagnosis present

## 2017-08-10 DIAGNOSIS — Z6838 Body mass index (BMI) 38.0-38.9, adult: Secondary | ICD-10-CM

## 2017-08-10 DIAGNOSIS — Z91048 Other nonmedicinal substance allergy status: Secondary | ICD-10-CM

## 2017-08-10 DIAGNOSIS — Z9049 Acquired absence of other specified parts of digestive tract: Secondary | ICD-10-CM | POA: Diagnosis not present

## 2017-08-10 DIAGNOSIS — Z87891 Personal history of nicotine dependence: Secondary | ICD-10-CM | POA: Diagnosis not present

## 2017-08-10 DIAGNOSIS — R1010 Upper abdominal pain, unspecified: Secondary | ICD-10-CM | POA: Diagnosis present

## 2017-08-10 DIAGNOSIS — N390 Urinary tract infection, site not specified: Secondary | ICD-10-CM | POA: Diagnosis present

## 2017-08-10 DIAGNOSIS — D649 Anemia, unspecified: Secondary | ICD-10-CM | POA: Diagnosis present

## 2017-08-10 HISTORY — DX: Nausea with vomiting, unspecified: R11.2

## 2017-08-10 HISTORY — DX: Nausea with vomiting, unspecified: Z98.890

## 2017-08-10 HISTORY — DX: Other complications of anesthesia, initial encounter: T88.59XA

## 2017-08-10 HISTORY — DX: Adverse effect of unspecified anesthetic, initial encounter: T41.45XA

## 2017-08-10 LAB — COMPREHENSIVE METABOLIC PANEL
ALBUMIN: 4.1 g/dL (ref 3.5–5.0)
ALK PHOS: 79 U/L (ref 38–126)
ALT: 22 U/L (ref 14–54)
AST: 21 U/L (ref 15–41)
Anion gap: 11 (ref 5–15)
BILIRUBIN TOTAL: 0.8 mg/dL (ref 0.3–1.2)
BUN: 18 mg/dL (ref 6–20)
CALCIUM: 10.2 mg/dL (ref 8.9–10.3)
CO2: 28 mmol/L (ref 22–32)
Chloride: 101 mmol/L (ref 101–111)
Creatinine, Ser: 1.09 mg/dL — ABNORMAL HIGH (ref 0.44–1.00)
GFR calc non Af Amer: 51 mL/min — ABNORMAL LOW (ref >60)
GFR, EST AFRICAN AMERICAN: 59 mL/min — AB (ref >60)
Glucose, Bld: 123 mg/dL — ABNORMAL HIGH (ref 65–99)
POTASSIUM: 4.3 mmol/L (ref 3.5–5.1)
Sodium: 140 mmol/L (ref 135–145)
TOTAL PROTEIN: 7.9 g/dL (ref 6.5–8.1)

## 2017-08-10 LAB — LIPASE, BLOOD: Lipase: 54 U/L — ABNORMAL HIGH (ref 11–51)

## 2017-08-10 LAB — CBC
HEMATOCRIT: 49.4 % — AB (ref 35.0–47.0)
HEMOGLOBIN: 17 g/dL — AB (ref 12.0–16.0)
MCH: 30.8 pg (ref 26.0–34.0)
MCHC: 34.4 g/dL (ref 32.0–36.0)
MCV: 89.5 fL (ref 80.0–100.0)
Platelets: 241 10*3/uL (ref 150–440)
RBC: 5.52 MIL/uL — AB (ref 3.80–5.20)
RDW: 13.2 % (ref 11.5–14.5)
WBC: 10.7 10*3/uL (ref 3.6–11.0)

## 2017-08-10 LAB — PANCREATIC ELASTASE, FECAL

## 2017-08-10 LAB — CALPROTECTIN, FECAL: Calprotectin, Fecal: <16 ug/g (ref 0–120)

## 2017-08-10 MED ORDER — ONDANSETRON HCL 4 MG/2ML IJ SOLN
4.0000 mg | Freq: Once | INTRAMUSCULAR | Status: AC
  Administered 2017-08-10: 4 mg via INTRAVENOUS

## 2017-08-10 MED ORDER — HYDROCODONE-ACETAMINOPHEN 5-325 MG PO TABS: 1.0000 | ORAL_TABLET | ORAL | Status: DC | PRN

## 2017-08-10 MED ORDER — DIPHENHYDRAMINE HCL 50 MG/ML IJ SOLN
12.5000 mg | Freq: Four times a day (QID) | INTRAMUSCULAR | Status: DC | PRN
  Administered 2017-08-11 – 2017-08-14 (×6): 12.5 mg via INTRAVENOUS
  Filled 2017-08-10 (×6): qty 1

## 2017-08-10 MED ORDER — ONDANSETRON 4 MG PO TBDP: 4.0000 mg | ORAL_TABLET | Freq: Four times a day (QID) | ORAL | Status: DC | PRN

## 2017-08-10 MED ORDER — HYDROMORPHONE HCL 1 MG/ML IJ SOLN
INTRAMUSCULAR | Status: AC
  Filled 2017-08-10: qty 1

## 2017-08-10 MED ORDER — HYDROMORPHONE HCL 1 MG/ML IJ SOLN
0.5000 mg | INTRAMUSCULAR | Status: DC | PRN
  Administered 2017-08-10 – 2017-08-12 (×6): 0.5 mg via INTRAVENOUS
  Filled 2017-08-10 (×5): qty 0.5

## 2017-08-10 MED ORDER — SODIUM CHLORIDE 0.9 % IV BOLUS
1000.0000 mL | Freq: Once | INTRAVENOUS | Status: AC
  Administered 2017-08-10: 1000 mL via INTRAVENOUS

## 2017-08-10 MED ORDER — POTASSIUM CHLORIDE IN NACL 20-0.45 MEQ/L-% IV SOLN
INTRAVENOUS | Status: DC
  Administered 2017-08-11 (×2): via INTRAVENOUS
  Filled 2017-08-10 (×3): qty 1000

## 2017-08-10 MED ORDER — FAMOTIDINE IN NACL 20-0.9 MG/50ML-% IV SOLN
20.0000 mg | INTRAVENOUS | Status: DC
  Administered 2017-08-11 (×2): 20 mg via INTRAVENOUS
  Filled 2017-08-10 (×2): qty 50

## 2017-08-10 MED ORDER — DIPHENHYDRAMINE HCL 12.5 MG/5ML PO ELIX
12.5000 mg | ORAL_SOLUTION | Freq: Four times a day (QID) | ORAL | Status: DC | PRN
  Filled 2017-08-10: qty 5

## 2017-08-10 MED ORDER — IOHEXOL 300 MG/ML  SOLN
100.0000 mL | Freq: Once | INTRAMUSCULAR | Status: AC | PRN
  Administered 2017-08-10: 100 mL via INTRAVENOUS

## 2017-08-10 MED ORDER — ENOXAPARIN SODIUM 40 MG/0.4ML ~~LOC~~ SOLN
40.0000 mg | SUBCUTANEOUS | Status: DC
  Administered 2017-08-11 – 2017-08-15 (×6): 40 mg via SUBCUTANEOUS
  Filled 2017-08-10 (×6): qty 0.4

## 2017-08-10 MED ORDER — ONDANSETRON HCL 4 MG/2ML IJ SOLN
4.0000 mg | Freq: Four times a day (QID) | INTRAMUSCULAR | Status: DC | PRN
Start: 2017-08-10 — End: 2017-08-12

## 2017-08-10 MED ORDER — MORPHINE SULFATE (PF) 4 MG/ML IV SOLN
INTRAVENOUS | Status: AC
  Administered 2017-08-10: 4 mg via INTRAVENOUS
  Filled 2017-08-10: qty 1

## 2017-08-10 MED ORDER — MENTHOL 3 MG MT LOZG
1.0000 | LOZENGE | OROMUCOSAL | Status: DC | PRN
  Filled 2017-08-10 (×3): qty 9

## 2017-08-10 MED ORDER — LORAZEPAM 2 MG/ML IJ SOLN
INTRAMUSCULAR | Status: AC
  Administered 2017-08-10: 1 mg via INTRAVENOUS
  Filled 2017-08-10: qty 1

## 2017-08-10 MED ORDER — LORAZEPAM 2 MG/ML IJ SOLN
1.0000 mg | Freq: Once | INTRAMUSCULAR | Status: AC
  Administered 2017-08-10: 1 mg via INTRAVENOUS

## 2017-08-10 MED ORDER — ONDANSETRON HCL 4 MG/2ML IJ SOLN
INTRAMUSCULAR | Status: AC
  Filled 2017-08-10: qty 2

## 2017-08-10 MED ORDER — MORPHINE SULFATE (PF) 4 MG/ML IV SOLN
4.0000 mg | Freq: Once | INTRAVENOUS | Status: AC
  Administered 2017-08-10: 4 mg via INTRAVENOUS

## 2017-08-10 MED ORDER — ONDANSETRON HCL 4 MG/2ML IJ SOLN
INTRAMUSCULAR | Status: AC
  Administered 2017-08-10: 4 mg via INTRAVENOUS
  Filled 2017-08-10: qty 2

## 2017-08-10 NOTE — ED Notes (Signed)
Patient transported to CT 

## 2017-08-10 NOTE — ED Triage Notes (Signed)
Pt comes into the ED via POV with her spouse c/o upper abdominal pain that radiates throughout her stomach.  Patient states she is nausea but denies any emesis.  Patient explains that she has only had one bowel movement today and her normal is 10 a day.  H/o multiple SBO and she states this feels the exact same.  Patient in NAd at this time with even and unlabored respirations.

## 2017-08-10 NOTE — ED Notes (Signed)
Patient's oxygen saturation 99% on 2L [place]. Oxygen decreased to 1L [place]. RN will continue to monitor.

## 2017-08-10 NOTE — ED Notes (Signed)
Patient had 2 small emeses. Zofran ordered and administered.

## 2017-08-10 NOTE — ED Notes (Signed)
Patient's oxygen saturation decreased to 89% on RA. Patient placed on 2L [place]. patient's oxygen saturation increased to 94% on 2L [place]. RN will continue to monitor.

## 2017-08-10 NOTE — ED Notes (Signed)
Patient transported to X-ray 

## 2017-08-10 NOTE — ED Provider Notes (Signed)
[facility] Emergency Department Provider Note  ___________________________________________   First MD Initiated Contact with Patient 08/10/17 1915     (approximate)  I have reviewed the triage vital signs and the nursing notes.   HISTORY  Chief Complaint Abdominal Pain   HPI Alexa GravesDonna B Abbott is a 67 y.o. female with a history of an appendectomy as well as cholecystectomy with multiple previous SBO's was presenting with upper abdominal pain which she describes a 7 out of 10 at this time.  Says the pain is across her abdomen abdomen and radiating to her right flank.  She says this pattern of pain is typical for her SBO's.  Says that she has not passed gas today.  Says that she has had nausea but no vomiting.  Says the pain is cramping and sharp.  Past Medical History:  Diagnosis Date  . Abdominal pain, generalized   . Anemia   . Asthma   . DDD (degenerative disc disease), lumbar 10/09/2014  . Diabetes (HCC) 07/25/2013  . Diabetes mellitus without complication (HCC)   . Hyperlipidemia   . 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 IBS (irritable bowel syndrome)   . Ileus (HCC)   . 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 Kidney stone   . Low urine output 06/06/2012  . Numbness in right leg 10/09/2014  . Plantar fasciitis   . Renal insufficiency   . Sacroiliac joint pain 10/22/2015  . SBO (small bowel obstruction) (HCC)   . 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 Type 2 diabetes mellitus [facility](HCC)     Patient Active Problem List   Diagnosis Date Noted  . Ileus (HCC)   . Small bowel obstruction (HCC) 04/13/2016  . Abdominal pain, generalized   . SBO (small bowel obstruction) (HCC)   . Sacroiliac joint pain 10/22/2015  . 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 DDD (degenerative disc disease),  lumbar 10/09/2014  . Numbness in right leg 10/09/2014  . Diabetes (HCC) 07/25/2013  . Kidney stone 06/06/2012  . 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 Low urine output 06/06/2012    Past Surgical History:  Procedure Laterality Date  . ABDOMINAL HYSTERECTOMY    . APPENDECTOMY    . CHOLECYSTECTOMY    . COLONOSCOPY    . MICRODISCECTOMY LUMBAR  2016    Prior to Admission medications   Medication Sig Start Date End Date Taking? Authorizing Provider  acetaminophen (TYLENOL) 500 MG tablet Take 1,000 mg by mouth every 6 (six) hours as needed.    [provider]  BAYER CONTOUR TEST test strip TEST ONCE D UTD 01/19/16   [provider]  Lactobacillus (PROBIOTIC ACIDOPHILUS PO) Take 1 tablet by mouth daily.    [provider]  loratadine (CLARITIN) 10 MG tablet Take 10 mg by mouth daily.    [provider]  metFORMIN (GLUCOPHAGE-XR) 500 MG 24 hr tablet Take 500 mg by mouth 2 (two) times daily. 12/11/15   [provider]  ondansetron (ZOFRAN ODT) 4 MG disintegrating tablet Take 1 tablet (4 mg total) by mouth every 8 (eight) hours as needed for nausea or vomiting. 03/31/17   Nita SickleVeronese, [place], MD  ranitidine (ZANTAC) 150 MG tablet Take 150 mg by mouth 2 (two) times daily. 01/28/16   [provider]    Allergies Adhesive [tape]; Codeine; and Penicillins  Family History  Problem Relation Age of Onset  . Diabetes Mother   . Hypertension Mother   . Stroke Mother   . Crohn's disease Mother   . Throat cancer Father   . Hypertension Father   . Diabetes Father   . Lung cancer Sister   . Cancer Sister        lung, liver, bone  . Anesthesia problems Daughter   . Diabetes Maternal Grandmother   . Breast cancer Neg Hx     Social History Social History   Tobacco Use  . Smoking status: Former Smoker    Last attempt to quit: 1998    Years since quitting: 21.4  . Smokeless tobacco: Never Used  Substance Use  Topics  . Alcohol use: Yes    Comment: occasionally  . Drug use: No    Review of Systems  Constitutional: No fever/chills Eyes: No visual changes. ENT: No sore throat. Cardiovascular: Denies chest pain. Respiratory: Denies shortness of breath. Gastrointestinal:no vomiting.  No diarrhea.  Genitourinary: Negative for dysuria. Musculoskeletal: Negative for back pain. Skin: Negative for rash. Neurological: Negative for headaches, focal weakness or numbness.   ____________________________________________   PHYSICAL EXAM:  VITAL SIGNS: ED Triage Vitals  Enc Vitals Group     BP 08/10/17 1847 122/65     Pulse Rate 08/10/17 1843 89     Resp 08/10/17 1843 19     Temp 08/10/17 1843 97.9 F (36.6 C)     Temp Source 08/10/17 1843 Oral     SpO2 08/10/17 1843 98 %     Weight --      Height --      Head Circumference --      Peak Flow --      Pain Score 08/10/17 1839 9     Pain Loc --      Pain Edu? --      Excl. in GC? --     Constitutional: Alert and oriented. Well appearing and in no acute distress. Eyes: Conjunctivae are normal.  Head: Atraumatic. Nose: No congestion/rhinnorhea. Mouth/Throat: Mucous membranes are moist.  Neck: No stridor.   Cardiovascular: Normal rate, regular rhythm. Grossly normal heart sounds.  Respiratory: Normal respiratory effort.  No retractions. Lungs CTAB. Gastrointestinal: Soft with moderate distention and diffuse tenderness to palpation. Musculoskeletal: No lower extremity tenderness nor edema.  No joint effusions. Neurologic:  Normal speech and language. No gross focal neurologic deficits are appreciated. Skin:  Skin is warm, dry and intact. No rash noted. Psychiatric: Mood and affect are normal. Speech and behavior are normal.  ____________________________________________   LABS (all labs ordered are listed, but only abnormal results are displayed)  Labs Reviewed  LIPASE, BLOOD - Abnormal; Notable for the following components:       Result Value   Lipase 54 (*)    All other components within normal limits  COMPREHENSIVE METABOLIC PANEL - Abnormal; Notable for the following components:   Glucose, Bld 123 (*)    Creatinine, Ser 1.09 (*)    GFR calc non Af Amer 51 (*)    GFR calc Af Amer 59 (*)    All other components within normal limits  CBC - Abnormal; Notable for the following components:   RBC 5.52 (*)    Hemoglobin 17.0 (*)    HCT 49.4 (*)    All other components within normal limits  URINALYSIS, COMPLETE (UACMP) WITH MICROSCOPIC   ____________________________________________  EKG   ____________________________________________  RADIOLOGY  Small bowel obstruction on  CAT scan ____________________________________________   PROCEDURES  Procedure(s) performed:   Procedures  Critical Care performed:   ____________________________________________   INITIAL IMPRESSION / ASSESSMENT AND PLAN / ED COURSE  Pertinent labs & imaging results that were available during my care of the patient were reviewed by me and considered in my medical decision making (see chart for details).  Differential diagnosis includes, but is not limited to, biliary disease (biliary colic, acute cholecystitis, cholangitis, choledocholithiasis, etc), intrathoracic causes for epigastric abdominal pain including ACS, gastritis, duodenitis, pancreatitis, small bowel or large bowel obstruction, abdominal aortic aneurysm, hernia, and ulcer(s). As part of my medical decision making, I reviewed the following data within the electronic MEDICAL RECORD NUMBER Notes from prior ED visits  ----------------------------------------- 9:42 PM on 08/10/2017 -----------------------------------------  Patient aware of need for admission for small bowel obstruction.  To be admitted to surgery.  Patient not vomiting.  Will not start NG tube at this time. ____________________________________________   FINAL CLINICAL IMPRESSION(S) / ED DIAGNOSES  Small  bowel obstruction.    NEW MEDICATIONS STARTED DURING THIS VISIT:  New Prescriptions   No medications on file     Note:  This document was prepared using Dragon voice recognition software and may include unintentional dictation errors.     Myrna Blazer, MD 08/10/17 2142

## 2017-08-10 NOTE — ED Notes (Signed)
This RN made 2 unsuccessful attempts at PIV insertion, left forearm; Iris RN to bedside to attempt PIV insertion

## 2017-08-10 NOTE — ED Notes (Signed)
Pt called this RN to room because primary RN not available.  Husband states pt feels like she cannot breathe, pt breathing rapid and shallow.  Pt flushed and clammy to the feel.  Husband states it started soon after morphine was given.  Dr. Pershing ProudSchaevitz in room and gave VO for ativan 1mg  IV push.  [facility].  Unable to scan ativan, Eileen StanfordJenna, RN verified bottle and medication given.  Scott, Pharmacist aware and to check out issues with scanning barcode.  Pt breathing even and non-labored at this time.  Pt states she feels better.  This RN educated on common side effects with IV morphine push.  Husband at bedside.  Eileen StanfordJenna, primary RN aware of issue.  CT aware to come get patient.

## 2017-08-10 NOTE — ED Notes (Signed)
Xray at bedside to verify ng placement.

## 2017-08-10 NOTE — H&P (Signed)
Alexa Abbott is a 67 y.o. female with 12 hours of crampy abdominal pain.  HPI: Patient is a pleasant 67 year old woman seen to the emergency room with a sudden onset of abdominal pain this morning.  The pain was primarily right lower quadrant right mid abdomen.  The pain was crampy in nature with significant exacerbations and then intermittent relief which she describes as like "labor pain".  She was markedly nauseated but did not vomit.  She did have a normal bowel movement this morning.  She has not been passing gas during the day.  Her husband relates that she had significant flatus yesterday.  She has history of multiple previous bowel obstructions.  She she has been admitted on several occasions with CT evidence and clinical presentation consistent with possible small bowel obstruction.  She has had 2 previous major GI surgeries with an appendicitis and a cholecystectomy both as a teenager.  She has a right paramedian scar.  She had a vaginal hysterectomy but does have her ovaries.  She denies any other surgical issues in her abdomen.  She has recently undergone a lumbar decompression surgery 2 months ago at [facility].  She has a history of severe irritable bowel syndrome manifested by multiple episodes of diarrhea.  She is set up for colonoscopy later next month.  She denies any history of hepatitis, yellow jaundice, pancreatitis, peptic ulcer disease, or diverticulitis.  Previous colonoscopies have demonstrated diverticulosis.  She has no history of cardiac disease hypertension or thyroid problems.  She does have adult onset diabetes currently controlled with oral medications.  Past Medical History:  Diagnosis Date  . Abdominal pain, generalized   . Anemia   . Asthma   . DDD (degenerative disc disease), lumbar 10/09/2014  . Diabetes (HCC) 07/25/2013  . Diabetes mellitus without complication (HCC)   . Hyperlipidemia   . 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 IBS (irritable bowel syndrome)   .  Ileus (HCC)   . 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 Kidney stone   . Low urine output 06/06/2012  . Numbness in right leg 10/09/2014  . Plantar fasciitis   . Renal insufficiency   . Sacroiliac joint pain 10/22/2015  . SBO (small bowel obstruction) (HCC)   . 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 Type 2 diabetes mellitus (HCC)    Past Surgical History:  Procedure Laterality Date  . ABDOMINAL HYSTERECTOMY    . APPENDECTOMY    . CHOLECYSTECTOMY    . COLONOSCOPY    . MICRODISCECTOMY LUMBAR  2016   Social History   Socioeconomic History  . Marital status: Married    Spouse name: Not on file  . Number of children: Not on file  . Years of education: Not on file  . Highest education level: Not on file  Occupational History  . Not on file  Social Needs  . Financial resource strain: Not on file  . Food insecurity:    Worry: Not on file    Inability: Not on file  . Transportation needs:    Medical: Not on file    Non-medical: Not on file  Tobacco Use  . Smoking status: Former Smoker    Last attempt to quit: 1998    Years since quitting: 21.4  . Smokeless tobacco: Never Used  Substance and Sexual Activity  . Alcohol use: Yes    Comment: occasionally  . Drug use: No  . Sexual activity: Not on file  Lifestyle  . Physical activity:    Days per week: Not on file    Minutes per session: Not on file  . Stress: Not on file  Relationships  . Social connections:    Talks on phone: Not on file    Gets together: Not on file    Attends religious service: Not on file    Active member of club or organization: Not on file    Attends meetings of clubs or organizations: Not on file    Relationship status: Not on file  Other Topics Concern  . Not on file  Social History Narrative  . Not on file     ROS 10 point review of systems was performed without any positive findings than those noted above.  She does  still have some numbness on the right side from her recent spine surgery.   PHYSICAL EXAM: BP (!) 127/57   Pulse 89   Temp 97.9 F (36.6 C) (Oral)   Resp 17   SpO2 95%   Physical Exam General: She appears comfortable at rest with good control of her pain with intravenous medication  HEENT: She has no pupillary or scleral abnormalities.  She is no evidence of any cranial deformities.  Neck: Supple with no adenopathy midline trachea  Lungs: Clear bilaterally with no adventitious sounds she has normal pulmonary excursion  Cardiac: She has no murmurs gallops to my year she seems to be normal sinus rhythm.  Abdomen: She is some mild right lower quadrant abdominal tenderness with some guarding.  She has no rebound.  She is moderately distended.  She has a well-healed paramedian scar.  She is active bowel sounds.  No other masses are noted.  Lower extremity: Full range of motion good distal pulses with no deformities  Neuro: No neurosensory deficits noted and normal cranial nerve function.  Psych: Normal orientation normal affect  Impression/Plan: I did not bill and reviewed her CT scan and her laboratory values.  CT scan does show partial small bowel obstruction with apparent transition zone in the right lower quadrant.  I anticipate that this obstruction is similar to her previous problems and likely related to her post surgery adhesions.  I discussed situation with the patient and her family.  We will begin nasogastric decompression and effort to relieve her obstruction.  However, in this setting with multiple obstructions in a short period of time I have asked her to consider possible elective surgery for more definitive resolution.  She is in agreement with the current plan.   Tiney Rougealph Ely III, MD  08/10/2017, 10:19 PM

## 2017-08-10 NOTE — ED Notes (Signed)
Called floor to let them know patient on the way 

## 2017-08-11 ENCOUNTER — Other Ambulatory Visit: Payer: Self-pay

## 2017-08-11 DIAGNOSIS — K5652 Intestinal adhesions [bands] with complete obstruction: Secondary | ICD-10-CM

## 2017-08-11 LAB — URINALYSIS, COMPLETE (UACMP) WITH MICROSCOPIC
Bilirubin Urine: NEGATIVE
GLUCOSE, UA: NEGATIVE mg/dL
Hgb urine dipstick: NEGATIVE
Ketones, ur: NEGATIVE mg/dL
Nitrite: NEGATIVE
PH: 5 (ref 5.0–8.0)
Protein, ur: NEGATIVE mg/dL
Specific Gravity, Urine: >1.046 — ABNORMAL HIGH (ref 1.005–1.030)

## 2017-08-11 LAB — BASIC METABOLIC PANEL
Anion gap: 9 (ref 5–15)
BUN: 26 mg/dL — AB (ref 6–20)
CALCIUM: 8.8 mg/dL — AB (ref 8.9–10.3)
CHLORIDE: 109 mmol/L (ref 101–111)
CO2: 21 mmol/L — ABNORMAL LOW (ref 22–32)
CREATININE: 1.47 mg/dL — AB (ref 0.44–1.00)
GFR, EST AFRICAN AMERICAN: 41 mL/min — AB (ref >60)
GFR, EST NON AFRICAN AMERICAN: 35 mL/min — AB (ref >60)
Glucose, Bld: 149 mg/dL — ABNORMAL HIGH (ref 65–99)
Potassium: 5 mmol/L (ref 3.5–5.1)
SODIUM: 139 mmol/L (ref 135–145)

## 2017-08-11 LAB — GLUCOSE, CAPILLARY
GLUCOSE-CAPILLARY: 86 mg/dL (ref 65–99)
Glucose-Capillary: 93 mg/dL (ref 65–99)

## 2017-08-11 LAB — CBC
HCT: 45 % (ref 35.0–47.0)
Hemoglobin: 15.4 g/dL (ref 12.0–16.0)
MCH: 30.9 pg (ref 26.0–34.0)
MCHC: 34.2 g/dL (ref 32.0–36.0)
MCV: 90.2 fL (ref 80.0–100.0)
Platelets: 192 10*3/uL (ref 150–440)
RBC: 4.99 MIL/uL (ref 3.80–5.20)
RDW: 13 % (ref 11.5–14.5)
WBC: 9.3 10*3/uL (ref 3.6–11.0)

## 2017-08-11 MED ORDER — SODIUM CHLORIDE 0.9 % IV SOLN
1.0000 g | INTRAVENOUS | Status: AC
  Administered 2017-08-11 – 2017-08-13 (×3): 1 g via INTRAVENOUS
  Filled 2017-08-11 (×3): qty 1

## 2017-08-11 MED ORDER — SODIUM CHLORIDE 0.45 % IV SOLN
INTRAVENOUS | Status: DC
  Administered 2017-08-11 – 2017-08-14 (×4): via INTRAVENOUS

## 2017-08-11 MED ORDER — INSULIN ASPART 100 UNIT/ML ~~LOC~~ SOLN
0.0000 [IU] | Freq: Every morning | SUBCUTANEOUS | Status: DC
  Administered 2017-08-15: 2 [IU] via SUBCUTANEOUS
  Filled 2017-08-11: qty 1

## 2017-08-11 MED ORDER — INSULIN ASPART 100 UNIT/ML ~~LOC~~ SOLN: 0.0000 [IU] | Freq: Three times a day (TID) | SUBCUTANEOUS | Status: DC

## 2017-08-11 MED ORDER — INSULIN ASPART 100 UNIT/ML ~~LOC~~ SOLN: 0.0000 [IU] | Freq: Every day | SUBCUTANEOUS | Status: DC

## 2017-08-11 NOTE — H&P (Signed)
[facility] [place] [facility]   PATIENT NAME: Alexa Abbott    MR#:  [id number]  DATE OF BIRTH:  1950-08-24  DATE OF ADMISSION:  08/10/2017  PRIMARY CARE PHYSICIAN: Dorothey Baseman, MD   REQUESTING/REFERRING PHYSICIAN:   CHIEF COMPLAINT:   Chief Complaint  Patient presents with  . Abdominal Pain    HISTORY OF PRESENT ILLNESS: Alexa Abbott  is a 67 y.o. female with a known history per below which also includes multiple episodes of partial small bowel obstruction, treated with conservative therapy in the past with resolution, presented with acute abdominal pain on yesterday to the emergency room, was found to have partial small bowel obstruction, admitted to general surgery service with conservative treatment with NG tube placement/bowel rest/IV fluids for rehydration, hospitalist service asked to see patient for diabetes management, patient evaluated at the bedside on the floor, husband at the bedside, patient in no apparent distress, resting comfortably in bed.  PAST MEDICAL HISTORY:   Past Medical History:  Diagnosis Date  . Abdominal pain, generalized   . Anemia   . Asthma   . DDD (degenerative disc disease), lumbar 10/09/2014  . Diabetes (HCC) 07/25/2013  . Diabetes mellitus without complication (HCC)   . Hyperlipidemia   . 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 IBS (irritable bowel syndrome)   . Ileus (HCC)   . 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 Kidney stone   . Low urine output 06/06/2012  . Numbness in right leg 10/09/2014  . Plantar fasciitis   . Renal insufficiency   . Sacroiliac joint pain 10/22/2015  . SBO (small bowel obstruction) (HCC)   . 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 Type 2 diabetes mellitus (HCC)     PAST SURGICAL HISTORY:  Past Surgical History:  Procedure Laterality Date  . ABDOMINAL HYSTERECTOMY    . APPENDECTOMY    . CHOLECYSTECTOMY    . COLONOSCOPY    .  MICRODISCECTOMY LUMBAR  2016    SOCIAL HISTORY:  Social History   Tobacco Use  . Smoking status: Former Smoker    Last attempt to quit: 1998    Years since quitting: 21.4  . Smokeless tobacco: Never Used  Substance Use Topics  . Alcohol use: Yes    Comment: occasionally    FAMILY HISTORY:  Family History  Problem Relation Age of Onset  . Diabetes Mother   . Hypertension Mother   . Stroke Mother   . Crohn's disease Mother   . Throat cancer Father   . Hypertension Father   . Diabetes Father   . Lung cancer Sister   . Cancer Sister        lung, liver, bone  . Anesthesia problems Daughter   . Diabetes Maternal Grandmother   . Breast cancer Neg Hx     DRUG ALLERGIES:  Allergies  Allergen Reactions  . Adhesive [Tape] Dermatitis  . Codeine Itching  . Penicillins Rash    Has patient had a PCN reaction causing immediate rash, facial/tongue/throat swelling, SOB or lightheadedness with hypotension: yes Has patient had a PCN reaction causing severe rash involving mucus membranes or skin necrosis: no Has patient had a PCN reaction that required hospitalization no Has patient had a PCN reaction occurring within the last 10 years: no If all of the above answers are "NO", then may proceed with Cephalosporin use.     REVIEW OF SYSTEMS:   CONSTITUTIONAL: No fever, fatigue or weakness.  EYES: No blurred or double vision.  EARS, NOSE, AND THROAT: No tinnitus or ear pain.  RESPIRATORY: No cough, shortness of breath, wheezing or hemoptysis.  CARDIOVASCULAR: No chest pain, orthopnea, edema.  GASTROINTESTINAL: + nausea,no vomiting, diarrhea , +abdominal pain.  GENITOURINARY: No dysuria, hematuria.  ENDOCRINE: No polyuria, nocturia,  HEMATOLOGY: No anemia, easy bruising or bleeding SKIN: No rash or lesion. MUSCULOSKELETAL: No joint pain or arthritis.   NEUROLOGIC: No tingling, numbness, weakness.  PSYCHIATRY: No anxiety or depression.   MEDICATIONS AT HOME:  Prior to Admission  medications   Medication Sig Start Date End Date Taking? Authorizing Provider  acetaminophen (TYLENOL) 500 MG tablet Take 1,000 mg by mouth every 6 (six) hours as needed for mild pain or headache.    Yes [provider]  Biotin 10 MG TABS Take 1 tablet by mouth every evening.   Yes [provider]  Lactobacillus (PROBIOTIC ACIDOPHILUS PO) Take 1 tablet by mouth daily.   Yes [provider]  loratadine (CLARITIN) 10 MG tablet Take 10 mg by mouth daily as needed for allergies.    Yes [provider]  metFORMIN (GLUCOPHAGE-XR) 500 MG 24 hr tablet Take 500 mg by mouth every evening.  12/11/15  Yes [provider]  ranitidine (ZANTAC) 150 MG tablet Take 150 mg by mouth 2 (two) times daily. 01/28/16  Yes [provider]  BAYER CONTOUR TEST test strip TEST ONCE D UTD 01/19/16   [provider]  ondansetron (ZOFRAN ODT) 4 MG disintegrating tablet Take 1 tablet (4 mg total) by mouth every 8 (eight) hours as needed for nausea or vomiting. Patient not taking: Reported on 08/10/2017 03/31/17   Nita Sickle, MD      PHYSICAL EXAMINATION:   VITAL SIGNS: Blood pressure (!) 114/53, pulse 79, temperature 98 F (36.7 C), temperature source Oral, resp. rate 18, height 5' (1.524 m), weight 88.6 kg (195 lb 5.2 oz), SpO2 94 %.  GENERAL:  67 y.o.-year-old patient lying in the bed with no acute distress.  Obese EYES: Pupils equal, round, reactive to light and accommodation. No scleral icterus. Extraocular muscles intact.  HEENT: Head atraumatic, normocephalic. Oropharynx and nasopharynx clear.  NECK:  Supple, no jugular venous distention. No thyroid enlargement, no tenderness.  LUNGS: Normal breath sounds bilaterally, no wheezing, rales,rhonchi or crepitation. No use of accessory muscles of respiration.  CARDIOVASCULAR: S1, S2 normal. No murmurs, rubs, or gallops.  ABDOMEN: Soft, nontender, nondistended. Bowel sounds present. No organomegaly or mass.   EXTREMITIES: No pedal edema, cyanosis, or clubbing.  NEUROLOGIC: Cranial nerves II through XII are intact. MAES  PSYCHIATRIC: The patient is alert and oriented x 3.  SKIN: No obvious rash, lesion, or ulcer.   LABORATORY PANEL:   CBC Recent Labs  Lab 08/10/17 1851 08/11/17 0502  WBC 10.7 9.3  HGB 17.0* 15.4  HCT 49.4* 45.0  PLT 241 192  MCV 89.5 90.2  MCH 30.8 30.9  MCHC 34.4 34.2  RDW 13.2 13.0   ------------------------------------------------------------------------------------------------------------------  Chemistries  Recent Labs  Lab 08/10/17 1851 08/11/17 0502  NA 140 139  K 4.3 5.0  CL 101 109  CO2 28 21*  GLUCOSE 123* 149*  BUN 18 26*  CREATININE 1.09* 1.47*  CALCIUM 10.2 8.8*  AST 21  --   ALT 22  --   ALKPHOS 79  --   BILITOT 0.8  --    ------------------------------------------------------------------------------------------------------------------ estimated creatinine clearance is 36.3 mL/min (A) (by C-G formula based on SCr of 1.47 mg/dL (H)). ------------------------------------------------------------------------------------------------------------------  No results for input(s): TSH, T4TOTAL, T3FREE, THYROIDAB in the last 72 hours.  Invalid input(s): FREET3   Coagulation profile No results for input(s): INR, PROTIME in the last 168 hours. ------------------------------------------------------------------------------------------------------------------- No results for input(s): DDIMER in the last 72 hours. -------------------------------------------------------------------------------------------------------------------  Cardiac Enzymes No results for input(s): CKMB, TROPONINI, MYOGLOBIN in the last 168 hours.  Invalid input(s): CK ------------------------------------------------------------------------------------------------------------------ Invalid input(s):  POCBNP  ---------------------------------------------------------------------------------------------------------------  Urinalysis    Component Value Date/Time   COLORURINE YELLOW (A) 08/11/2017 0933   APPEARANCEUR HAZY (A) 08/11/2017 0933   APPEARANCEUR Hazy 07/21/2012 1433   LABSPEC >1.046 (H) 08/11/2017 0933   LABSPEC 1.016 07/21/2012 1433   PHURINE 5.0 08/11/2017 0933   GLUCOSEU NEGATIVE 08/11/2017 0933   GLUCOSEU Negative 07/21/2012 1433   HGBUR NEGATIVE 08/11/2017 0933   BILIRUBINUR NEGATIVE 08/11/2017 0933   BILIRUBINUR Negative 07/21/2012 1433   KETONESUR NEGATIVE 08/11/2017 0933   PROTEINUR NEGATIVE 08/11/2017 0933   NITRITE NEGATIVE 08/11/2017 0933   LEUKOCYTESUR MODERATE (A) 08/11/2017 0933   LEUKOCYTESUR 3+ 07/21/2012 1433     RADIOLOGY: Dg Abdomen 1 View  Result Date: 08/10/2017 CLINICAL DATA:  NG tube placement. EXAM: ABDOMEN - 1 VIEW COMPARISON:  Radiographs and CT earlier this day. FINDINGS: Tip and side port of the enteric tube below the diaphragm in the stomach. Dilated small bowel in the upper abdomen again seen. No evidence of free air. IMPRESSION: Tip and side port of the enteric tube below the diaphragm in the stomach. Electronically Signed   By: Rubye OaksMelanie  Ehinger M.D.   On: 08/10/2017 23:40   Ct Abdomen Pelvis W Contrast  Result Date: 08/10/2017 CLINICAL DATA:  Upper abdominal pain radiating through stomach. History of multiple small bowel obstructions. EXAM: CT ABDOMEN AND PELVIS WITH CONTRAST TECHNIQUE: Multidetector CT imaging of the abdomen and pelvis was performed using the standard protocol following bolus administration of intravenous contrast. CONTRAST:  100mL OMNIPAQUE IOHEXOL 300 MG/ML  SOLN COMPARISON:  March 31, 2017 FINDINGS: Lower chest: No acute abnormality. Hepatobiliary: Intra and extrahepatic biliary duct prominence is likely due to a combination of previous cholecystectomy and bowel obstruction described below. No liver masses identified.  The portal vein is patent. Pancreas: Unremarkable. No pancreatic ductal dilatation or surrounding inflammatory changes. Spleen: Normal in size without focal abnormality. Adrenals/Urinary Tract: Adrenal glands are normal. The left kidney is normal. The right kidney is atrophic and contains calcifications, unchanged. No ureteral stones or dilatation. The bladder is decompressed but unremarkable. Stomach/Bowel: The stomach and proximal small bowel are normal. There is dilatation of the small bowel from the level the mid jejunum to the level of the mid ileum. The transition point is best seen on coronal image 23 in the right abdomen anteriorly. There is fecal material in the dilated small bowel just proximal to the transition point. The colon is relatively decompressed. Colonic diverticulosis without diverticulitis. The appendix is not seen but there is no secondary evidence of appendicitis. Vascular/Lymphatic: Atherosclerotic changes are seen in the nonaneurysmal aorta. No adenopathy. Reproductive: Status post hysterectomy. No adnexal masses. Other: No free air or free fluid. Musculoskeletal: No acute or significant osseous findings. IMPRESSION: 1. Small bowel obstruction with a transition point in the right abdomen anteriorly in the mid ileum. 2. Intra and extrahepatic biliary duct dilatation is mildly more prominent in the interval, probably due to a combination of previous cholecystectomy and the small-bowel obstruction. Recommend correlation with labs. 3. Atherosclerotic changes in the abdominal aorta. Electronically Signed   By: Gerome Samavid  Williams III M.D  On: 08/10/2017 21:07   Dg Abd 2 Views  Result Date: 08/10/2017 CLINICAL DATA:  Small bowel obstruction. EXAM: ABDOMEN - 2 VIEW COMPARISON:  CT earlier this day. FINDINGS: Air-fluid level in the stomach. Air-fluid levels throughout dilated small bowel in the central abdomen, as seen on CT. Excreted IV contrast in the renal collecting systems and urinary  bladder. Right renal stones on CT are partially obscured by excreted IV contrast. No evidence of free air. IMPRESSION: Small bowel obstruction, similar to CT 2 hours ago. Electronically Signed   By: Rubye Oaks M.D.   On: 08/10/2017 23:04   Mm 3d Screen Breast Bilateral  Result Date: 08/10/2017 CLINICAL DATA:  Screening. EXAM: DIGITAL SCREENING BILATERAL MAMMOGRAM WITH TOMO AND CAD COMPARISON:  Previous exam(s). ACR Breast Density Category a: The breast tissue is almost entirely fatty. FINDINGS: There are no findings suspicious for malignancy. Images were processed with CAD. IMPRESSION: No mammographic evidence of malignancy. A result letter of this screening mammogram will be mailed directly to the patient. RECOMMENDATION: Screening mammogram in one year. (Code:SM-B-01Y) BI-RADS CATEGORY  1: Negative. Electronically Signed   By: Britta Mccreedy M.D.   On: 08/10/2017 11:42    EKG: Orders placed or performed during the hospital encounter of 08/10/17  . ED EKG  . ED EKG    IMPRESSION AND PLAN: *Acute recurrent partial small bowel obstruction Plan of care per primary service, currently being treated with NG tube, bowel rest, IV fluids for rehydration  *Chronic controlled diabetes mellitus type 2  Well-controlled Stable Hemoglobin A1c typically between 5-6 per family Hold metformin, sliding scale insulin with Accu-Cheks every a.m., IV fluids for rehydration, n.p.o. as stated above  *Acute probable UTI Rocephin for 3-day course and follow-up on cultures  *History of asthma without exacerbation Stable breathing treatments as needed  *Chronic anemia Stable Nonactive issue  *History of kidney stones  stable Nonactive issue    All the records are reviewed and case discussed with ED provider. Management plans discussed with the patient, family and they are in agreement.  CODE STATUS:full    Code Status Orders  (From admission, onward)        Start     Ordered   08/10/17  2214  Full code  Continuous     08/10/17 2219    Code Status History    Date Active Date Inactive Code Status Order ID Comments User Context   04/12/2016 2034 04/15/2016 1447 Full Code [id number]  Lattie Haw, MD ED       TOTAL TIME TAKING CARE OF THIS PATIENT: 35 minutes.    Evelena Asa Camille Thau M.D on 08/11/2017   Between 7am to 6pm - Pager - [phone]  After 6pm go to [email or web address] - password [email or web address]  [facility]  Office  [phone]  CC: Primary care physician; Dorothey Baseman, MD   Note: This dictation was prepared with Dragon dictation along with smaller phrase technology. Any transcriptional errors that result from this process are unintentional.

## 2017-08-11 NOTE — Progress Notes (Signed)
SURGICAL PROGRESS NOTE (cpt [phone])  Hospital Day(s): 1.   Post op day(s):  Marland Kitchen.   Interval History: Patient seen and examined, no acute events or new complaints since admission overnight. Patient reports resolution of abdominal pain and nausea with no further emesis since insertion of NG tube, describes a single episode of flatus, denies fever/chills, CP, or SOB. She and her husband also state that they've been discussing Dr. Marlowe KaysEly's advised consideration for outpatient elective lysis of adhesions, considering multiply recurrent short-interval SBO's, and would like to proceed upon resolution of current SBO.  Review of Systems:  Constitutional: denies fever, chills  HEENT: denies cough or congestion  Respiratory: denies any shortness of breath  Cardiovascular: denies chest pain or palpitations  Gastrointestinal: abdominal pain, N/V, and bowel function as per interval history Genitourinary: denies burning with urination or urinary frequency Musculoskeletal: denies pain, decreased motor or sensation Integumentary: denies any other rashes or skin discolorations Neurological: denies HA or vision/hearing changes   Vital signs in last 24 hours: [min-max] current  Temp:  [97.6 F (36.4 C)-97.9 F (36.6 C)] 97.6 F (36.4 C) (06/14 [id number]) Pulse Rate:  [76-95] 89 (06/14 0608) Resp:  [16-23] 16 (06/13 2352) BP: (93-127)/(54-65) 105/54 (06/14 0608) SpO2:  [89 %-100 %] 94 % (06/14 0608) Weight:  [195 lb 5.2 oz (88.6 kg)] 195 lb 5.2 oz (88.6 kg) (06/13 2352)     Height: 5' (152.4 cm) Weight: 195 lb 5.2 oz (88.6 kg) BMI (Calculated): 38.15   Intake/Output this shift:  Total I/O In: 175 [I.V.:175] Out: -    Intake/Output last 2 shifts:  @IOLAST2SHIFTS @   Physical Exam:  Constitutional: alert, cooperative and no distress  HENT: normocephalic without obvious abnormality  Eyes: PERRL, EOM's grossly intact and symmetric  Neuro: CN II - XII grossly intact and symmetric without deficit   Respiratory: breathing non-labored at rest  Cardiovascular: regular rate and sinus rhythm  Gastrointestinal: soft and non-tender with moderate abdominal distention Musculoskeletal: UE and LE FROM, no edema or wounds, motor and sensation grossly intact, NT   Labs:  CBC Latest Ref Rng & Units 08/11/2017 08/10/2017 03/31/2017  WBC 3.6 - 11.0 K/uL 9.3 10.7 13.2(H)  Hemoglobin 12.0 - 16.0 g/dL 62.915.4 17.0(H) 15.9  Hematocrit 35.0 - 47.0 % 45.0 49.4(H) 47.5(H)  Platelets 150 - 440 K/uL 192 241 291   CMP Latest Ref Rng & Units 08/11/2017 08/10/2017 03/31/2017  Glucose 65 - 99 mg/dL 528(U149(H) 132(G123(H) 401(U119(H)  BUN 6 - 20 mg/dL 27(O26(H) 18 20  Creatinine 0.44 - 1.00 mg/dL 5.36(U1.47(H) 4.40(H1.09(H) 4.74(Q1.02(H)  Sodium 135 - 145 mmol/L 139 140 141  Potassium 3.5 - 5.1 mmol/L 5.0 4.3 4.1  Chloride 101 - 111 mmol/L 109 101 106  CO2 22 - 32 mmol/L 21(L) 28 23  Calcium 8.9 - 10.3 mg/dL 5.9(D8.8(L) 63.810.2 9.9  Total Protein 6.5 - 8.1 g/dL - 7.9 7.6  Total Bilirubin 0.3 - 1.2 mg/dL - 0.8 0.8  Alkaline Phos 38 - 126 U/L - 79 86  AST 15 - 41 U/L - 21 26  ALT 14 - 54 U/L - 22 18   Imaging studies: No new pertinent imaging studies, though admission CT and subsequent abdominal x-rays personally reviewed   Assessment/Plan: (ICD-10's: 26K56.52) 67 y.o. 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  - NPO for now, IV fluids             -  continue NG tube for nasogastric decompression             - monitor ongoing bowel function and abdominal exam              - anticipate symptomatic relief within 24 - 48 hours following NGT insertion, followed by "rumbling" the following day and flatus either the same day or the day following the "rumbling" with anticipated length of stay ~3 - 5 days with successful non-operative  management for 8 of 10 patients with small bowel obstruction attributed to post-surgical adhesions  - outpatient elective lysis of adhesions for multiply recurrent short-interval SBO's discussed and all of patient's and husband's questions answered  - surgical intervention if doesn't improve was also discussed             - medical management comorbidities as per medical team             - DVT prophylaxis, ambulation encouraged   All of the above findings and recommendations were discussed with the patient and patient's husband, and all of patient's and family's questions were answered to their expressed satisfaction.  -- Scherrie Gerlach Earlene Plater, MD, RPVI [place]: [facility] General Surgery - Partnering for exceptional care. Office: [phone]

## 2017-08-12 LAB — GLUCOSE, CAPILLARY
GLUCOSE-CAPILLARY: 72 mg/dL (ref 65–99)
Glucose-Capillary: 94 mg/dL (ref 65–99)
Glucose-Capillary: 87 mg/dL (ref 65–99)
Glucose-Capillary: 82 mg/dL (ref 65–99)

## 2017-08-12 LAB — HIV ANTIBODY (ROUTINE TESTING W REFLEX): HIV SCREEN 4TH GENERATION: NONREACTIVE

## 2017-08-12 MED ORDER — ALUM & MAG HYDROXIDE-SIMETH 200-200-20 MG/5ML PO SUSP
30.0000 mL | Freq: Four times a day (QID) | ORAL | Status: DC | PRN
  Administered 2017-08-12 – 2017-08-15 (×2): 30 mL via ORAL
  Filled 2017-08-12 (×2): qty 30

## 2017-08-12 MED ORDER — ACETAMINOPHEN 10 MG/ML IV SOLN
1000.0000 mg | Freq: Once | INTRAVENOUS | Status: AC
  Administered 2017-08-12: 1000 mg via INTRAVENOUS
  Filled 2017-08-12: qty 100

## 2017-08-12 MED ORDER — FAMOTIDINE IN NACL 20-0.9 MG/50ML-% IV SOLN
20.0000 mg | Freq: Two times a day (BID) | INTRAVENOUS | Status: DC
  Administered 2017-08-12 – 2017-08-16 (×8): 20 mg via INTRAVENOUS
  Filled 2017-08-12 (×8): qty 50

## 2017-08-12 MED ORDER — FAMOTIDINE IN NACL 20-0.9 MG/50ML-% IV SOLN: 20.0000 mg | Freq: Two times a day (BID) | INTRAVENOUS | Status: DC

## 2017-08-12 NOTE — Progress Notes (Signed)
SURGICAL PROGRESS NOTE (cpt [id number])  Hospital Day(s): 2.   Post op day(s):  Marland Kitchen.   Interval History: Patient seen and examined, reports lower chest and epigastric abdominal pain overnight, for which on-call hospitalist ordered maalox via NG tube without relief. Patient otherwise continues to report occasional intermittent +flatus without BM and with +belching without N/V, fever/chills, CP, or SOB.  Review of Systems:  Constitutional: denies fever, chills  HEENT: denies cough or congestion  Respiratory: denies any shortness of breath  Cardiovascular: denies chest pain or palpitations  Gastrointestinal: abdominal pain, N/V, and bowel function as per interval history Genitourinary: denies burning with urination or urinary frequency Musculoskeletal: denies pain, decreased motor or sensation Integumentary: denies any other rashes or skin discolorations Neurological: denies HA or vision/hearing changes   Vital signs in last 24 hours: [min-max] current  Temp:  [97.4 F (36.3 C)-98.3 F (36.8 C)] 98.3 F (36.8 C) (06/15 0438) Pulse Rate:  [79-81] 81 (06/15 0438) Resp:  [18-20] 18 (06/15 0438) BP: (110-114)/(49-55) 110/49 (06/15 0438) SpO2:  [91 %-94 %] 91 % (06/15 0438)     Height: 5' (152.4 cm) Weight: 195 lb 5.2 oz (88.6 kg) BMI (Calculated): 38.15   Intake/Output this shift:  Total I/O In: 252 [I.V.:252] Out: 750 [Urine:700; Emesis/NG output:50]   Intake/Output last 2 shifts:  @IOLAST2SHIFTS @   Physical Exam:  Constitutional: alert, cooperative and no distress  HENT: normocephalic without obvious abnormality  Eyes: PERRL, EOM's grossly intact and symmetric  Neuro: CN II - XII grossly intact and symmetric without deficit  Respiratory: breathing non-labored at rest  Cardiovascular: regular rate and sinus rhythm  Gastrointestinal: abdomen soft and less distended with mild focal epigastric tenderness to palpation, NG tubing with a column of yellow fluid not tidaling or being  evacuated into suction canister, immediate relief from all described epigastric/chest pain with flushing of NG tube to restore function, followed by patient seen happily ambulating in halls Musculoskeletal: UE and LE FROM, no edema or wounds, motor and sensation grossly intact, NT   Labs:  CBC Latest Ref Rng & Units 08/11/2017 08/10/2017 03/31/2017  WBC 3.6 - 11.0 K/uL 9.3 10.7 13.2(H)  Hemoglobin 12.0 - 16.0 g/dL 60.415.4 17.0(H) 15.9  Hematocrit 35.0 - 47.0 % 45.0 49.4(H) 47.5(H)  Platelets 150 - 440 K/uL 192 241 291   CMP Latest Ref Rng & Units 08/11/2017 08/10/2017 03/31/2017  Glucose 65 - 99 mg/dL 540(J149(H) 811(B123(H) 147(W119(H)  BUN 6 - 20 mg/dL 29(F26(H) 18 20  Creatinine 0.44 - 1.00 mg/dL 6.21(H1.47(H) 0.86(V1.09(H) 7.84(O1.02(H)  Sodium 135 - 145 mmol/L 139 140 141  Potassium 3.5 - 5.1 mmol/L 5.0 4.3 4.1  Chloride 101 - 111 mmol/L 109 101 106  CO2 22 - 32 mmol/L 21(L) 28 23  Calcium 8.9 - 10.3 mg/dL 9.6(E8.8(L) 95.210.2 9.9  Total Protein 6.5 - 8.1 g/dL - 7.9 7.6  Total Bilirubin 0.3 - 1.2 mg/dL - 0.8 0.8  Alkaline Phos 38 - 126 U/L - 79 86  AST 15 - 41 U/L - 21 26  ALT 14 - 54 U/L - 22 18   Imaging studies: No new pertinent imaging studies   Assessment/Plan: (ICD-10's: K56.52) 67 y.o. 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  - NPO for now, IV fluids - continue NG tube for nasogastric decompression - monitor ongoing bowel function and abdominal exam  - anticipate symptomatic  relief within 24 - 48 hours following NGT insertion, followed by "rumbling" the following day and flatus either the same day or the day following the "rumbling" with anticipated length of stay ~3 - 5 days with successful non-operative management for 8 of 10 patients with small  bowel obstruction attributed to post-surgical adhesions             - outpatient elective lysis of adhesions for multiply recurrent short-interval SBO's discussed and all of patient's and husband's questions answered             - surgical intervention if doesn't improve was also discussed - medical management comorbidities as per medical team - DVT prophylaxis, ambulation encouraged  All of the above findings and recommendations were discussed with the patient and patient's husband, and all of patient's and family's questions were answered to their expressed satisfaction.  -- Scherrie Gerlach Earlene Plater, MD, RPVI [place]: [facility] General Surgery - Partnering for exceptional care. Office: [phone]

## 2017-08-12 NOTE — Progress Notes (Signed)
[facility] - [place] at [facility]   PATIENT NAME: Sparkle Aube    MR#:  [id number]  DATE OF BIRTH:  1950-11-21  SUBJECTIVE:  CHIEF COMPLAINT:   Chief Complaint  Patient presents with  . Abdominal Pain  Complains of epigastric discomfort only, husband at bedside  REVIEW OF SYSTEMS:  CONSTITUTIONAL: No fever, fatigue or weakness.  EYES: No blurred or double vision.  EARS, NOSE, AND THROAT: No tinnitus or ear pain.  RESPIRATORY: No cough, shortness of breath, wheezing or hemoptysis.  CARDIOVASCULAR: No chest pain, orthopnea, edema.  GASTROINTESTINAL: No nausea, vomiting, diarrhea or abdominal pain.  GENITOURINARY: No dysuria, hematuria.  ENDOCRINE: No polyuria, nocturia,  HEMATOLOGY: No anemia, easy bruising or bleeding SKIN: No rash or lesion. MUSCULOSKELETAL: No joint pain or arthritis.   NEUROLOGIC: No tingling, numbness, weakness.  PSYCHIATRY: No anxiety or depression.   ROS  DRUG ALLERGIES:   Allergies  Allergen Reactions  . Adhesive [Tape] Dermatitis  . Codeine Itching  . Penicillins Rash    Has patient had a PCN reaction causing immediate rash, facial/tongue/throat swelling, SOB or lightheadedness with hypotension: yes Has patient had a PCN reaction causing severe rash involving mucus membranes or skin necrosis: no Has patient had a PCN reaction that required hospitalization no Has patient had a PCN reaction occurring within the last 10 years: no If all of the above answers are "NO", then may proceed with Cephalosporin use.     VITALS:  Blood pressure (!) 110/49, pulse 81, temperature 98.3 F (36.8 C), temperature source Oral, resp. rate 18, height 5' (1.524 m), weight 88.6 kg (195 lb 5.2 oz), SpO2 91 %.  PHYSICAL EXAMINATION:  GENERAL:  67 y.o.-year-old patient lying in the bed with no acute distress.  EYES: Pupils equal, round, reactive to light and accommodation. No scleral icterus. Extraocular muscles intact.  HEENT: Head atraumatic,  normocephalic. Oropharynx and nasopharynx clear.  NECK:  Supple, no jugular venous distention. No thyroid enlargement, no tenderness.  LUNGS: Normal breath sounds bilaterally, no wheezing, rales,rhonchi or crepitation. No use of accessory muscles of respiration.  CARDIOVASCULAR: S1, S2 normal. No murmurs, rubs, or gallops.  ABDOMEN: Soft, nontender, nondistended. Bowel sounds present. No organomegaly or mass.  EXTREMITIES: No pedal edema, cyanosis, or clubbing.  NEUROLOGIC: Cranial nerves II through XII are intact. Muscle strength 5/5 in all extremities. Sensation intact. Gait not checked.  PSYCHIATRIC: The patient is alert and oriented x 3.  SKIN: No obvious rash, lesion, or ulcer.   Physical Exam LABORATORY PANEL:   CBC Recent Labs  Lab 08/11/17 0502  WBC 9.3  HGB 15.4  HCT 45.0  PLT 192   ------------------------------------------------------------------------------------------------------------------  Chemistries  Recent Labs  Lab 08/10/17 1851 08/11/17 0502  NA 140 139  K 4.3 5.0  CL 101 109  CO2 28 21*  GLUCOSE 123* 149*  BUN 18 26*  CREATININE 1.09* 1.47*  CALCIUM 10.2 8.8*  AST 21  --   ALT 22  --   ALKPHOS 79  --   BILITOT 0.8  --    ------------------------------------------------------------------------------------------------------------------  Cardiac Enzymes No results for input(s): TROPONINI in the last 168 hours. ------------------------------------------------------------------------------------------------------------------  RADIOLOGY:  Dg Abdomen 1 View  Result Date: 08/10/2017 CLINICAL DATA:  NG tube placement. EXAM: ABDOMEN - 1 VIEW COMPARISON:  Radiographs and CT earlier this day. FINDINGS: Tip and side port of the enteric tube below the diaphragm in the stomach. Dilated small bowel in the upper abdomen again seen. No evidence of free air. IMPRESSION: Tip  and side port of the enteric tube below the diaphragm in the stomach. Electronically  Signed   By: Rubye OaksMelanie  Ehinger M.D.   On: 08/10/2017 23:40   Ct Abdomen Pelvis W Contrast  Result Date: 08/10/2017 CLINICAL DATA:  Upper abdominal pain radiating through stomach. History of multiple small bowel obstructions. EXAM: CT ABDOMEN AND PELVIS WITH CONTRAST TECHNIQUE: Multidetector CT imaging of the abdomen and pelvis was performed using the standard protocol following bolus administration of intravenous contrast. CONTRAST:  100mL OMNIPAQUE IOHEXOL 300 MG/ML  SOLN COMPARISON:  March 31, 2017 FINDINGS: Lower chest: No acute abnormality. Hepatobiliary: Intra and extrahepatic biliary duct prominence is likely due to a combination of previous cholecystectomy and bowel obstruction described below. No liver masses identified. The portal vein is patent. Pancreas: Unremarkable. No pancreatic ductal dilatation or surrounding inflammatory changes. Spleen: Normal in size without focal abnormality. Adrenals/Urinary Tract: Adrenal glands are normal. The left kidney is normal. The right kidney is atrophic and contains calcifications, unchanged. No ureteral stones or dilatation. The bladder is decompressed but unremarkable. Stomach/Bowel: The stomach and proximal small bowel are normal. There is dilatation of the small bowel from the level the mid jejunum to the level of the mid ileum. The transition point is best seen on coronal image 23 in the right abdomen anteriorly. There is fecal material in the dilated small bowel just proximal to the transition point. The colon is relatively decompressed. Colonic diverticulosis without diverticulitis. The appendix is not seen but there is no secondary evidence of appendicitis. Vascular/Lymphatic: Atherosclerotic changes are seen in the nonaneurysmal aorta. No adenopathy. Reproductive: Status post hysterectomy. No adnexal masses. Other: No free air or free fluid. Musculoskeletal: No acute or significant osseous findings. IMPRESSION: 1. Small bowel obstruction with a transition  point in the right abdomen anteriorly in the mid ileum. 2. Intra and extrahepatic biliary duct dilatation is mildly more prominent in the interval, probably due to a combination of previous cholecystectomy and the small-bowel obstruction. Recommend correlation with labs. 3. Atherosclerotic changes in the abdominal aorta. Electronically Signed   By: Gerome Samavid  Williams III M.D   On: 08/10/2017 21:07   Dg Abd 2 Views  Result Date: 08/10/2017 CLINICAL DATA:  Small bowel obstruction. EXAM: ABDOMEN - 2 VIEW COMPARISON:  CT earlier this day. FINDINGS: Air-fluid level in the stomach. Air-fluid levels throughout dilated small bowel in the central abdomen, as seen on CT. Excreted IV contrast in the renal collecting systems and urinary bladder. Right renal stones on CT are partially obscured by excreted IV contrast. No evidence of free air. IMPRESSION: Small bowel obstruction, similar to CT 2 hours ago. Electronically Signed   By: Rubye OaksMelanie  Ehinger M.D.   On: 08/10/2017 23:04    ASSESSMENT AND PLAN:  *Acute recurrent partial small bowel obstruction Plan of care per primary service, currently being treated with NG tube, bowel rest, IV fluids for rehydration  *Chronic controlled diabetes mellitus type 2  Well-controlled Hemoglobin A1c typically between 5-6 per family Continue to hold metformin, continue SSI w/ Accu-Cheks every a.m., on IV fluids for rehydration  *Acute probable UTI Stable Continue Rocephin for 3-day course and follow-up on cultures  *History of asthma without exacerbation Stable breathing treatments as needed  *Chronic anemia Stable Nonactive issue  *History of kidney stones  stable Nonactive issue  All the records are reviewed and case discussed with Care Management/Social Workerr. Management plans discussed with the patient, family and they are in agreement.  CODE STATUS: full  TOTAL TIME TAKING CARE OF  THIS PATIENT: 35 minutes.     POSSIBLE D/C IN 1-3 DAYS, DEPENDING ON  CLINICAL CONDITION.   Evelena Asa Wilbon Obenchain M.D on 08/12/2017   Between 7am to 6pm - Pager - [phone]  After 6pm go to [email or web address] - password [email or web address]  [facility]  Office  [phone]  CC: Primary care physician; Dorothey Baseman, MD  Note: This dictation was prepared with Dragon dictation along with smaller phrase technology. Any transcriptional errors that result from this process are unintentional.

## 2017-08-13 ENCOUNTER — Inpatient Hospital Stay: Payer: Medicare Other

## 2017-08-13 LAB — BASIC METABOLIC PANEL
Anion gap: 11 (ref 5–15)
BUN: 16 mg/dL (ref 6–20)
CALCIUM: 8.7 mg/dL — AB (ref 8.9–10.3)
CO2: 23 mmol/L (ref 22–32)
Chloride: 105 mmol/L (ref 101–111)
Creatinine, Ser: 1.1 mg/dL — ABNORMAL HIGH (ref 0.44–1.00)
GFR calc Af Amer: 58 mL/min — ABNORMAL LOW (ref >60)
GFR, EST NON AFRICAN AMERICAN: 50 mL/min — AB (ref >60)
Glucose, Bld: 81 mg/dL (ref 65–99)
Potassium: 3.9 mmol/L (ref 3.5–5.1)
Sodium: 139 mmol/L (ref 135–145)

## 2017-08-13 LAB — URINE CULTURE

## 2017-08-13 LAB — GLUCOSE, CAPILLARY
GLUCOSE-CAPILLARY: 110 mg/dL — AB (ref 65–99)
GLUCOSE-CAPILLARY: 73 mg/dL (ref 65–99)
Glucose-Capillary: 69 mg/dL (ref 65–99)
Glucose-Capillary: 66 mg/dL (ref 65–99)
Glucose-Capillary: 72 mg/dL (ref 65–99)

## 2017-08-13 LAB — TROPONIN I: Troponin I: <0.03 ng/mL (ref <0.03)

## 2017-08-13 MED ORDER — CHLORPROMAZINE HCL 25 MG/ML IJ SOLN
12.5000 mg | Freq: Four times a day (QID) | INTRAMUSCULAR | Status: DC | PRN
  Filled 2017-08-13 (×2): qty 0.5

## 2017-08-13 MED ORDER — DEXTROSE 50 % IV SOLN
25.0000 mL | Freq: Once | INTRAVENOUS | Status: AC
  Administered 2017-08-13: 25 mL via INTRAVENOUS

## 2017-08-13 MED ORDER — DEXTROSE 50 % IV SOLN
INTRAVENOUS | Status: AC
  Filled 2017-08-13: qty 50

## 2017-08-13 MED ORDER — NITROGLYCERIN 0.4 MG SL SUBL
0.4000 mg | SUBLINGUAL_TABLET | SUBLINGUAL | Status: DC | PRN
  Administered 2017-08-13: 0.4 mg via SUBLINGUAL
  Filled 2017-08-13: qty 1

## 2017-08-13 MED ORDER — KETOROLAC TROMETHAMINE 15 MG/ML IJ SOLN
15.0000 mg | Freq: Once | INTRAMUSCULAR | Status: AC
  Administered 2017-08-14: 15 mg via INTRAVENOUS
  Filled 2017-08-13: qty 1

## 2017-08-13 NOTE — Progress Notes (Signed)
[employer] - [place] at [facility]   PATIENT NAME: Alexa Abbott    MR#:  [id number]  DATE OF BIRTH:  June 21, 1950  SUBJECTIVE:  CHIEF COMPLAINT:   Chief Complaint  Patient presents with  . Abdominal Pain  No events overnight, positive flatus, no complications per nursing staff  REVIEW OF SYSTEMS:  CONSTITUTIONAL: No fever, fatigue or weakness.  EYES: No blurred or double vision.  EARS, NOSE, AND THROAT: No tinnitus or ear pain.  RESPIRATORY: No cough, shortness of breath, wheezing or hemoptysis.  CARDIOVASCULAR: No chest pain, orthopnea, edema.  GASTROINTESTINAL: No nausea, vomiting, diarrhea or abdominal pain.  GENITOURINARY: No dysuria, hematuria.  ENDOCRINE: No polyuria, nocturia,  HEMATOLOGY: No anemia, easy bruising or bleeding SKIN: No rash or lesion. MUSCULOSKELETAL: No joint pain or arthritis.   NEUROLOGIC: No tingling, numbness, weakness.  PSYCHIATRY: No anxiety or depression.   ROS  DRUG ALLERGIES:   Allergies  Allergen Reactions  . Adhesive [Tape] Dermatitis  . Codeine Itching  . Penicillins Rash    Has patient had a PCN reaction causing immediate rash, facial/tongue/throat swelling, SOB or lightheadedness with hypotension: yes Has patient had a PCN reaction causing severe rash involving mucus membranes or skin necrosis: no Has patient had a PCN reaction that required hospitalization no Has patient had a PCN reaction occurring within the last 10 years: no If all of the above answers are "NO", then may proceed with Cephalosporin use.     VITALS:  Blood pressure (!) 153/62, pulse 84, temperature 98.6 F (37 C), resp. rate 20, height 5' (1.524 m), weight 88.6 kg (195 lb 5.2 oz), SpO2 98 %.  PHYSICAL EXAMINATION:  GENERAL:  67 y.o.-year-old patient lying in the bed with no acute distress.  EYES: Pupils equal, round, reactive to light and accommodation. No scleral icterus. Extraocular muscles intact.  HEENT: Head atraumatic, normocephalic.  Oropharynx and nasopharynx clear.  NECK:  Supple, no jugular venous distention. No thyroid enlargement, no tenderness.  LUNGS: Normal breath sounds bilaterally, no wheezing, rales,rhonchi or crepitation. No use of accessory muscles of respiration.  CARDIOVASCULAR: S1, S2 normal. No murmurs, rubs, or gallops.  ABDOMEN: Soft, nontender, nondistended. Bowel sounds present. No organomegaly or mass.  EXTREMITIES: No pedal edema, cyanosis, or clubbing.  NEUROLOGIC: Cranial nerves II through XII are intact. Muscle strength 5/5 in all extremities. Sensation intact. Gait not checked.  PSYCHIATRIC: The patient is alert and oriented x 3.  SKIN: No obvious rash, lesion, or ulcer.   Physical Exam LABORATORY PANEL:   CBC Recent Labs  Lab 08/11/17 0502  WBC 9.3  HGB 15.4  HCT 45.0  PLT 192   ------------------------------------------------------------------------------------------------------------------  Chemistries  Recent Labs  Lab 08/10/17 1851  08/13/17 0339  NA 140   < > 139  K 4.3   < > 3.9  CL 101   < > 105  CO2 28   < > 23  GLUCOSE 123*   < > 81  BUN 18   < > 16  CREATININE 1.09*   < > 1.10*  CALCIUM 10.2   < > 8.7*  AST 21  --   --   ALT 22  --   --   ALKPHOS 79  --   --   BILITOT 0.8  --   --    < > = values in this interval not displayed.   ------------------------------------------------------------------------------------------------------------------  Cardiac Enzymes Recent Labs  Lab 08/13/17 0339  TROPONINI <0.03   ------------------------------------------------------------------------------------------------------------------  RADIOLOGY:  No results  found.  ASSESSMENT AND PLAN:  *Acute recurrent partial small bowel obstruction Resolving Plan of care per primary service, currently being treated with NGT, bowel rest, and IV fluids for rehydration  *Chronic controlled diabetes mellitus type 2  Well-controlled Hemoglobin A1c typically between 5-6 per  family Continue to hold metformin, continue SSI w/ Accu-Cheks every a.m., on IV fluids for rehydration  *Acute probable UTI Stable Continue Rocephin for 3-day course  *History of asthma without exacerbation Stable Team breathing treatments as needed  *Chronic anemia Stable Nonactive issue  *History of kidney stones  stable Nonactive issue   All the records are reviewed and case discussed with Care Management/Social Workerr. Management plans discussed with the patient, family and they are in agreement.  CODE STATUS:full TOTAL TIME TAKING CARE OF THIS PATIENT: 35 minutes.     POSSIBLE D/C IN 1-3 DAYS, DEPENDING ON CLINICAL CONDITION.   Evelena AsaMontell D Aeon Kessner M.D on 08/13/2017   Between 7am to 6pm - Pager - [phone]  After 6pm go to [email or web address] - password [email or web address]  [facility] [place] [facility]  Office  [phone]  CC: Primary care physician; Dorothey BasemanBronstein, David, MD  Note: This dictation was prepared with Dragon dictation along with smaller phrase technology. Any transcriptional errors that result from this process are unintentional.

## 2017-08-13 NOTE — Progress Notes (Signed)
SURGICAL PROGRESS NOTE (cpt [phone])  Hospital Day(s): 3.   Post op day(s):  Marland Kitchen   Interval History: Patient seen and examined, has passed several more episodes of +flatus and a small BM yesterday evening, but she also again complains of focal epigastric pain and belching, both of which she says begin when her NG stops working despite reportedly being flushed by patient's RN. Patient reports her abdominal distention has just about resolved. Patient otherwise denies N/V, fever/chills, CP, or SOB.  Review of Systems:  Constitutional: denies fever, chills  HEENT: denies cough or congestion  Respiratory: denies any shortness of breath  Cardiovascular: denies chest pain or palpitations  Gastrointestinal: abdominal pain, N/V, and bowel function as per interval history Genitourinary: denies burning with urination or urinary frequency Musculoskeletal: denies pain, decreased motor or sensation Integumentary: denies any other rashes or skin discolorations Neurological: denies HA or vision/hearing changes   Vital signs in last 24 hours: [min-max] current  Temp:  [97.9 F (36.6 C)-98.7 F (37.1 C)] 98.6 F (37 C) (06/16 0259) Pulse Rate:  [84-85] 84 (06/15 1948) Resp:  [20] 20 (06/15 1948) BP: (118-153)/(56-67) 153/62 (06/16 0600) SpO2:  [95 %-99 %] 98 % (06/16 0600)     Height: 5' (152.4 cm) Weight: 195 lb 5.2 oz (88.6 kg) BMI (Calculated): 38.15   Intake/Output this shift:  Total I/O In: 305 [I.V.:305] Out: 500 [Urine:500]   Intake/Output last 2 shifts:  @IOLAST2SHIFTS @   Physical Exam:  Constitutional: alert, cooperative and no distress  HENT: normocephalic without obvious abnormality  Eyes: PERRL, EOM's grossly intact and symmetric  Neuro: CN II - XII grossly intact and symmetric without deficit  Respiratory: breathing non-labored at rest  Cardiovascular: regular rate and sinus rhythm  Gastrointestinal: soft, non-tender except very focal moderate TTP over epigastrium, and  near-completely non-distended Musculoskeletal: UE and LE FROM, no edema or wounds, motor and sensation grossly intact, NT   Labs:  CBC Latest Ref Rng & Units 08/11/2017 08/10/2017 03/31/2017  WBC 3.6 - 11.0 K/uL 9.3 10.7 13.2(H)  Hemoglobin 12.0 - 16.0 g/dL 19.1 17.0(H) 15.9  Hematocrit 35.0 - 47.0 % 45.0 49.4(H) 47.5(H)  Platelets 150 - 440 K/uL 192 241 291   CMP Latest Ref Rng & Units 08/13/2017 08/11/2017 08/10/2017  Glucose 65 - 99 mg/dL 81 478(G) 956(O)  BUN 6 - 20 mg/dL 16 13(Y) 18  Creatinine 0.44 - 1.00 mg/dL 8.65(H) 8.46(N) 6.29(B)  Sodium 135 - 145 mmol/L 139 139 140  Potassium 3.5 - 5.1 mmol/L 3.9 5.0 4.3  Chloride 101 - 111 mmol/L 105 109 101  CO2 22 - 32 mmol/L 23 21(L) 28  Calcium 8.9 - 10.3 mg/dL 2.8(U) 1.3(K) 44.0  Total Protein 6.5 - 8.1 g/dL - - 7.9  Total Bilirubin 0.3 - 1.2 mg/dL - - 0.8  Alkaline Phos 38 - 126 U/L - - 79  AST 15 - 41 U/L - - 21  ALT 14 - 54 U/L - - 22   Imaging studies: No new pertinent imaging studies, though prior post-NG tube insertion abdominal x-ray again reviewed   Assessment/Plan: (ICD-10's: K56.52) 67 y.o.femaleappears to be recovering as expected, but NG tube not doing as well for multiply recurrentcomplete small bowel obstruction, likely attributable to post-surgical adhesions, complicated by pertinent comorbidities includingDM, HLD, asthma, IBS, 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  - NPO for now, IV fluids - monitor ongoing bowel function and abdominal exam  -NG tube flushed, retracted a few cm, and re-secured, after which patient  reports resolution of all abdominal complaints - if patient continues feeling well with +flatus, anticipate removal of NG tube tomorrow, but may consider am x-ray considering NG tube difficulties - outpatient elective lysis of adhesions for multiply recurrent short-interval SBO's discussed and all of  patient's and husband's questions answered - surgical intervention if doesn't improve was also discussed - medical management comorbidities per medical team -DVT prophylaxis,ambulation encouraged  All of the above findings and recommendations were discussed with the patientandpatient's husband, and all of patient's and family's questions were answered to their expressed satisfaction.  -- Scherrie GerlachJason E. Earlene Plateravis, MD, RPVI [place]: [facility] General Surgery - Partnering for exceptional care. Office: [phone]

## 2017-08-13 NOTE — Progress Notes (Signed)
Pain more centered in mid upper back, blood pressure 149/57..Dr. Anne HahnWillis notified. Orders for trop and EKG obtained

## 2017-08-13 NOTE — Progress Notes (Signed)
Complaining of "indigestion" after going to restroom. NG flushed and patent and draining greenish drainage.

## 2017-08-14 ENCOUNTER — Encounter
Admission: EM | Disposition: A | Discharge status: Home / Self Care | Payer: Self-pay | Source: Home / Self Care | Attending: Surgery

## 2017-08-14 ENCOUNTER — Other Ambulatory Visit: Payer: Self-pay

## 2017-08-14 ENCOUNTER — Inpatient Hospital Stay: Payer: Medicare Other | Admitting: Anesthesiology

## 2017-08-14 ENCOUNTER — Encounter: Payer: Self-pay | Admitting: *Deleted

## 2017-08-14 ENCOUNTER — Inpatient Hospital Stay: Payer: Medicare Other

## 2017-08-14 HISTORY — PX: LAPAROTOMY: SHX154

## 2017-08-14 LAB — GLUCOSE, CAPILLARY
GLUCOSE-CAPILLARY: 69 mg/dL (ref 65–99)
GLUCOSE-CAPILLARY: 116 mg/dL — AB (ref 65–99)
GLUCOSE-CAPILLARY: 71 mg/dL (ref 65–99)
Glucose-Capillary: 67 mg/dL (ref 65–99)
Glucose-Capillary: 110 mg/dL — ABNORMAL HIGH (ref 65–99)
Glucose-Capillary: 110 mg/dL — ABNORMAL HIGH (ref 65–99)
Glucose-Capillary: 183 mg/dL — ABNORMAL HIGH (ref 65–99)

## 2017-08-14 LAB — BASIC METABOLIC PANEL
Anion gap: 13 (ref 5–15)
BUN: 14 mg/dL (ref 6–20)
CALCIUM: 8.8 mg/dL — AB (ref 8.9–10.3)
CO2: 21 mmol/L — ABNORMAL LOW (ref 22–32)
CREATININE: 1.04 mg/dL — AB (ref 0.44–1.00)
Chloride: 108 mmol/L (ref 101–111)
GFR, EST NON AFRICAN AMERICAN: 54 mL/min — AB (ref >60)
Glucose, Bld: 72 mg/dL (ref 65–99)
Potassium: 4 mmol/L (ref 3.5–5.1)
SODIUM: 142 mmol/L (ref 135–145)

## 2017-08-14 SURGERY — LAPAROTOMY, EXPLORATORY
Anesthesia: General | Wound class: Clean

## 2017-08-14 MED ORDER — ACETAMINOPHEN 160 MG/5ML PO SOLN
325.0000 mg | ORAL | Status: DC | PRN
  Filled 2017-08-14: qty 20.3

## 2017-08-14 MED ORDER — HYDROMORPHONE HCL 1 MG/ML IJ SOLN
0.5000 mg | INTRAMUSCULAR | Status: DC | PRN
  Administered 2017-08-14: 0.5 mg via INTRAVENOUS

## 2017-08-14 MED ORDER — ROCURONIUM BROMIDE 100 MG/10ML IV SOLN
INTRAVENOUS | Status: DC | PRN
  Administered 2017-08-14: 50 mg via INTRAVENOUS

## 2017-08-14 MED ORDER — BUPIVACAINE LIPOSOME 1.3 % IJ SUSP
INTRAMUSCULAR | Status: DC | PRN
  Administered 2017-08-14: 20 mL

## 2017-08-14 MED ORDER — FENTANYL CITRATE (PF) 100 MCG/2ML IJ SOLN
INTRAMUSCULAR | Status: AC
  Filled 2017-08-14: qty 2

## 2017-08-14 MED ORDER — LIDOCAINE HCL (CARDIAC) PF 100 MG/5ML IV SOSY
PREFILLED_SYRINGE | INTRAVENOUS | Status: DC | PRN
  Administered 2017-08-14: 100 mg via INTRAVENOUS

## 2017-08-14 MED ORDER — ACETAMINOPHEN 10 MG/ML IV SOLN
INTRAVENOUS | Status: AC
  Filled 2017-08-14: qty 100

## 2017-08-14 MED ORDER — ACETAMINOPHEN 10 MG/ML IV SOLN
INTRAVENOUS | Status: DC | PRN
  Administered 2017-08-14: 1000 mg via INTRAVENOUS

## 2017-08-14 MED ORDER — HYDROMORPHONE HCL 1 MG/ML IJ SOLN
INTRAMUSCULAR | Status: AC
  Filled 2017-08-14: qty 1

## 2017-08-14 MED ORDER — ACETAMINOPHEN 10 MG/ML IV SOLN
1000.0000 mg | Freq: Four times a day (QID) | INTRAVENOUS | Status: AC
  Administered 2017-08-14 – 2017-08-15 (×4): 1000 mg via INTRAVENOUS
  Filled 2017-08-14 (×6): qty 100

## 2017-08-14 MED ORDER — BUPIVACAINE LIPOSOME 1.3 % IJ SUSP
INTRAMUSCULAR | Status: AC
  Filled 2017-08-14: qty 20

## 2017-08-14 MED ORDER — PHENYLEPHRINE HCL 10 MG/ML IJ SOLN
INTRAMUSCULAR | Status: DC | PRN
  Administered 2017-08-14 (×5): 100 ug via INTRAVENOUS

## 2017-08-14 MED ORDER — DEXTROSE 50 % IV SOLN
25.0000 mL | Freq: Once | INTRAVENOUS | Status: AC
  Administered 2017-08-14: 25 mL via INTRAVENOUS

## 2017-08-14 MED ORDER — FENTANYL CITRATE (PF) 100 MCG/2ML IJ SOLN
INTRAMUSCULAR | Status: DC | PRN
  Administered 2017-08-14 (×2): 50 ug via INTRAVENOUS

## 2017-08-14 MED ORDER — DEXAMETHASONE SODIUM PHOSPHATE 10 MG/ML IJ SOLN
INTRAMUSCULAR | Status: DC | PRN
  Administered 2017-08-14: 5 mg via INTRAVENOUS

## 2017-08-14 MED ORDER — MIDAZOLAM HCL 2 MG/2ML IJ SOLN
INTRAMUSCULAR | Status: AC
  Filled 2017-08-14: qty 2

## 2017-08-14 MED ORDER — PROMETHAZINE HCL 25 MG/ML IJ SOLN: 6.2500 mg | INTRAMUSCULAR | Status: DC | PRN

## 2017-08-14 MED ORDER — BUPIVACAINE-EPINEPHRINE (PF) 0.25% -1:200000 IJ SOLN
INTRAMUSCULAR | Status: DC | PRN
  Administered 2017-08-14: 30 mL via PERINEURAL

## 2017-08-14 MED ORDER — PROPOFOL 10 MG/ML IV BOLUS
INTRAVENOUS | Status: AC
  Filled 2017-08-14: qty 20

## 2017-08-14 MED ORDER — SUGAMMADEX SODIUM 500 MG/5ML IV SOLN
INTRAVENOUS | Status: DC | PRN
  Administered 2017-08-14: 200 mg via INTRAVENOUS

## 2017-08-14 MED ORDER — HYDROMORPHONE HCL 1 MG/ML IJ SOLN
INTRAMUSCULAR | Status: AC
  Administered 2017-08-14: 0.25 mg via INTRAVENOUS
  Filled 2017-08-14: qty 1

## 2017-08-14 MED ORDER — CEFAZOLIN SODIUM-DEXTROSE 1-4 GM/50ML-% IV SOLN
INTRAVENOUS | Status: DC | PRN
  Administered 2017-08-14: 2 g via INTRAVENOUS

## 2017-08-14 MED ORDER — ACETAMINOPHEN 325 MG PO TABS: 325.0000 mg | ORAL_TABLET | ORAL | Status: DC | PRN

## 2017-08-14 MED ORDER — MEPERIDINE HCL 50 MG/ML IJ SOLN: 6.2500 mg | INTRAMUSCULAR | Status: DC | PRN

## 2017-08-14 MED ORDER — MIDAZOLAM HCL 2 MG/2ML IJ SOLN
INTRAMUSCULAR | Status: DC | PRN
  Administered 2017-08-14: 2 mg via INTRAVENOUS

## 2017-08-14 MED ORDER — KETOROLAC TROMETHAMINE 30 MG/ML IJ SOLN
INTRAMUSCULAR | Status: DC | PRN
  Administered 2017-08-14: 15 mg via INTRAVENOUS

## 2017-08-14 MED ORDER — LACTATED RINGERS IV SOLN
INTRAVENOUS | Status: DC | PRN
  Administered 2017-08-14: 13:00:00 via INTRAVENOUS

## 2017-08-14 MED ORDER — MORPHINE SULFATE (PF) 2 MG/ML IV SOLN
2.0000 mg | INTRAVENOUS | Status: DC | PRN
  Administered 2017-08-14 – 2017-08-15 (×5): 2 mg via INTRAVENOUS
  Filled 2017-08-14 (×5): qty 1

## 2017-08-14 MED ORDER — DEXTROSE 50 % IV SOLN
INTRAVENOUS | Status: AC
  Filled 2017-08-14: qty 50

## 2017-08-14 MED ORDER — DEXMEDETOMIDINE HCL 200 MCG/2ML IV SOLN
INTRAVENOUS | Status: DC | PRN
  Administered 2017-08-14: 12 ug via INTRAVENOUS

## 2017-08-14 MED ORDER — DEXTROSE IN LACTATED RINGERS 5 % IV SOLN
INTRAVENOUS | Status: DC
  Administered 2017-08-14 – 2017-08-15 (×4): via INTRAVENOUS

## 2017-08-14 MED ORDER — SUCCINYLCHOLINE CHLORIDE 20 MG/ML IJ SOLN
INTRAMUSCULAR | Status: DC | PRN
  Administered 2017-08-14: 140 mg via INTRAVENOUS

## 2017-08-14 MED ORDER — PROPOFOL 10 MG/ML IV BOLUS
INTRAVENOUS | Status: DC | PRN
  Administered 2017-08-14: 160 mg via INTRAVENOUS

## 2017-08-14 MED ORDER — KETOROLAC TROMETHAMINE 30 MG/ML IJ SOLN
30.0000 mg | Freq: Four times a day (QID) | INTRAMUSCULAR | Status: DC
  Administered 2017-08-14 – 2017-08-16 (×7): 30 mg via INTRAVENOUS
  Filled 2017-08-14 (×7): qty 1

## 2017-08-14 MED ORDER — BUPIVACAINE-EPINEPHRINE (PF) 0.25% -1:200000 IJ SOLN
INTRAMUSCULAR | Status: AC
  Filled 2017-08-14: qty 30

## 2017-08-14 MED ORDER — HYDROMORPHONE HCL 1 MG/ML IJ SOLN
0.2500 mg | INTRAMUSCULAR | Status: AC | PRN
  Administered 2017-08-14 (×8): 0.25 mg via INTRAVENOUS

## 2017-08-14 MED ORDER — ONDANSETRON HCL 4 MG/2ML IJ SOLN
INTRAMUSCULAR | Status: DC | PRN
  Administered 2017-08-14: 4 mg via INTRAVENOUS

## 2017-08-14 MED ORDER — EPHEDRINE SULFATE 50 MG/ML IJ SOLN
INTRAMUSCULAR | Status: DC | PRN
  Administered 2017-08-14: 5 mg via INTRAVENOUS

## 2017-08-14 SURGICAL SUPPLY — 47 items
APPLIER CLIP 11 MED OPEN (CLIP)
APPLIER CLIP 13 LRG OPEN (CLIP)
BLADE CLIPPER SURG (BLADE) IMPLANT
BLADE SURG 15 STRL LF DISP TIS (BLADE) IMPLANT
BLADE SURG 15 STRL SS (BLADE)
BLADE SURG SZ10 CARB STEEL (BLADE) ×3 IMPLANT
CANISTER SUCT 3000ML PPV (MISCELLANEOUS) ×3 IMPLANT
CHLORAPREP W/TINT 26ML (MISCELLANEOUS) ×3 IMPLANT
CLIP APPLIE 11 MED OPEN (CLIP) IMPLANT
CLIP APPLIE 13 LRG OPEN (CLIP) IMPLANT
DRAPE LAPAROTOMY 100X77 ABD (DRAPES) ×3 IMPLANT
DRAPE TABLE BACK 80X90 (DRAPES) IMPLANT
DRSG OPSITE POSTOP 4X10 (GAUZE/BANDAGES/DRESSINGS) ×3 IMPLANT
DRSG TEGADERM 2-3/8X2-3/4 SM (GAUZE/BANDAGES/DRESSINGS) IMPLANT
DRSG TELFA 3X8 NADH (GAUZE/BANDAGES/DRESSINGS) IMPLANT
ELECT BLADE 6.5 EXT (BLADE) ×3 IMPLANT
ELECT REM PT RETURN 9FT ADLT (ELECTROSURGICAL) ×3
ELECTRODE REM PT RTRN 9FT ADLT (ELECTROSURGICAL) ×1 IMPLANT
GAUZE SPONGE 4X4 12PLY STRL (GAUZE/BANDAGES/DRESSINGS) IMPLANT
GLOVE BIO SURGEON STRL SZ7 (GLOVE) ×12 IMPLANT
GOWN STRL REUS W/ TWL LRG LVL3 (GOWN DISPOSABLE) ×3 IMPLANT
GOWN STRL REUS W/TWL LRG LVL3 (GOWN DISPOSABLE) ×6
HANDLE SUCTION POOLE (INSTRUMENTS) ×1 IMPLANT
HANDLE YANKAUER SUCT BULB TIP (MISCELLANEOUS) ×3 IMPLANT
LIGASURE IMPACT 36 18CM CVD LR (INSTRUMENTS) IMPLANT
NEEDLE HYPO 22GX1.5 SAFETY (NEEDLE) ×3 IMPLANT
NEEDLE HYPO 25X1 1.5 SAFETY (NEEDLE) ×3 IMPLANT
PACK BASIN MAJOR ARMC (MISCELLANEOUS) ×3 IMPLANT
RELOAD PROXIMATE 75MM BLUE (ENDOMECHANICALS) IMPLANT
SPONGE LAP 18X18 RF (DISPOSABLE) ×3 IMPLANT
SPONGE LAP 18X36 RFD (DISPOSABLE) IMPLANT
STAPLER PROXIMATE 75MM BLUE (STAPLE) IMPLANT
STAPLER SKIN PROX 35W (STAPLE) ×3 IMPLANT
SUCTION POOLE HANDLE (INSTRUMENTS) ×3
SUT PDS AB 0 CT1 27 (SUTURE) ×3 IMPLANT
SUT PDS AB 1 TP1 96 (SUTURE) ×3 IMPLANT
SUT SILK 2 0 (SUTURE) ×2
SUT SILK 2 0 SH CR/8 (SUTURE) ×3 IMPLANT
SUT SILK 2 0SH CR/8 30 (SUTURE) ×3 IMPLANT
SUT SILK 2-0 18XBRD TIE 12 (SUTURE) ×1 IMPLANT
SUT VIC AB 0 CT1 36 (SUTURE) ×6 IMPLANT
SUT VIC AB 2-0 SH 27 (SUTURE) ×4
SUT VIC AB 2-0 SH 27XBRD (SUTURE) ×2 IMPLANT
SYR 30ML LL (SYRINGE) ×6 IMPLANT
SYR 3ML LL SCALE MARK (SYRINGE) IMPLANT
TAPE MICROFOAM 4IN (TAPE) IMPLANT
TRAY FOLEY MTR SLVR 16FR STAT (SET/KITS/TRAYS/PACK) ×3 IMPLANT

## 2017-08-14 NOTE — Anesthesia Post-op Follow-up Note (Signed)
Anesthesia QCDR form completed.        

## 2017-08-14 NOTE — Progress Notes (Signed)
[facility] -  at [facility]   PATIENT NAME: Alexa Abbott    MR#:  [id number]  DATE OF BIRTH:  April 09, 1950  SUBJECTIVE:  CHIEF COMPLAINT:   Chief Complaint  Patient presents with  . Abdominal Pain  General surgery note reviewed-discussion with patient/4 OR later today  REVIEW OF SYSTEMS:  CONSTITUTIONAL: No fever, fatigue or weakness.  EYES: No blurred or double vision.  EARS, NOSE, AND THROAT: No tinnitus or ear pain.  RESPIRATORY: No cough, shortness of breath, wheezing or hemoptysis.  CARDIOVASCULAR: No chest pain, orthopnea, edema.  GASTROINTESTINAL: No nausea, vomiting, diarrhea or abdominal pain.  GENITOURINARY: No dysuria, hematuria.  ENDOCRINE: No polyuria, nocturia,  HEMATOLOGY: No anemia, easy bruising or bleeding SKIN: No rash or lesion. MUSCULOSKELETAL: No joint pain or arthritis.   NEUROLOGIC: No tingling, numbness, weakness.  PSYCHIATRY: No anxiety or depression.   ROS  DRUG ALLERGIES:   Allergies  Allergen Reactions  . Adhesive [Tape] Dermatitis  . Codeine Itching  . Penicillins Rash    Has patient had a PCN reaction causing immediate rash, facial/tongue/throat swelling, SOB or lightheadedness with hypotension: yes Has patient had a PCN reaction causing severe rash involving mucus membranes or skin necrosis: no Has patient had a PCN reaction that required hospitalization no Has patient had a PCN reaction occurring within the last 10 years: no If all of the above answers are "NO", then may proceed with Cephalosporin use.     VITALS:  Blood pressure (!) 150/72, pulse 83, temperature 98.5 F (36.9 C), temperature source Oral, resp. rate 20, height 5' (1.524 m), weight 88.6 kg (195 lb 5.2 oz), SpO2 99 %.  PHYSICAL EXAMINATION:  GENERAL:  67 y.o.-year-old patient lying in the bed with no acute distress.  EYES: Pupils equal, round, reactive to light and accommodation. No scleral icterus. Extraocular muscles intact.  HEENT: Head  atraumatic, normocephalic. Oropharynx and nasopharynx clear.  NECK:  Supple, no jugular venous distention. No thyroid enlargement, no tenderness.  LUNGS: Normal breath sounds bilaterally, no wheezing, rales,rhonchi or crepitation. No use of accessory muscles of respiration.  CARDIOVASCULAR: S1, S2 normal. No murmurs, rubs, or gallops.  ABDOMEN: Soft, nontender, nondistended. Bowel sounds present. No organomegaly or mass.  EXTREMITIES: No pedal edema, cyanosis, or clubbing.  NEUROLOGIC: Cranial nerves II through XII are intact. Muscle strength 5/5 in all extremities. Sensation intact. Gait not checked.  PSYCHIATRIC: The patient is alert and oriented x 3.  SKIN: No obvious rash, lesion, or ulcer.   Physical Exam LABORATORY PANEL:   CBC Recent Labs  Lab 08/11/17 0502  WBC 9.3  HGB 15.4  HCT 45.0  PLT 192   ------------------------------------------------------------------------------------------------------------------  Chemistries  Recent Labs  Lab 08/10/17 1851  08/14/17 0532  NA 140   < > 142  K 4.3   < > 4.0  CL 101   < > 108  CO2 28   < > 21*  GLUCOSE 123*   < > 72  BUN 18   < > 14  CREATININE 1.09*   < > 1.04*  CALCIUM 10.2   < > 8.8*  AST 21  --   --   ALT 22  --   --   ALKPHOS 79  --   --   BILITOT 0.8  --   --    < > = values in this interval not displayed.   ------------------------------------------------------------------------------------------------------------------  Cardiac Enzymes Recent Labs  Lab 08/13/17 0339  TROPONINI <0.03   ------------------------------------------------------------------------------------------------------------------  RADIOLOGY:  Dg Abd Portable 1v  Result Date: 08/13/2017 CLINICAL DATA:  Follow-up small bowel obstruction. EXAM: PORTABLE ABDOMEN - 1 VIEW COMPARISON:  08/10/17 FINDINGS: The enteric tube is looped within the left upper quadrant of the abdomen. Persistent dilated loops of small bowel measure up to 5.3 cm on  today's study. This has increased from 3.5 cm previously. Decreased colonic gas noted. IMPRESSION: 1. Interval progression of small bowel dilatation. Electronically Signed   By: Signa Kell M.D.   On: 08/13/2017 17:15   Dg Abd Portable 2v  Result Date: 08/14/2017 CLINICAL DATA:  Concern for small bowel obstruction EXAM: PORTABLE ABDOMEN - 2 VIEW COMPARISON:  08/13/2017; 08/10/2017; CT abdomen pelvis-08/10/2017 FINDINGS: Re-demonstrated moderate gaseous distention of a moderate length of small bowel within the left mid hemiabdomen with index portion measuring approximately 4.1 cm in diameter, minimally improved compared to the 06/16 examination, previously, 5.3 cm. There is a minimal amount of colonic gas seen within the cecum and ascending colon. No pneumoperitoneum, pneumatosis or portal venous gas. Enteric tube tip and side port projected the expected location of the gastric fundus. No definitive abnormal intra-abdominal calcifications. Limited visualization of lower thorax demonstrates minimal bibasilar opacities, left greater than right, likely atelectasis. No definite acute osseous abnormalities. IMPRESSION: No change to slight improvement in findings again worrisome for persistent at least partial small bowel obstruction. Continued attention on follow-up is recommended. Electronically Signed   By: Simonne Come M.D.   On: 08/14/2017 08:08    ASSESSMENT AND PLAN:  *Acute recurrent partial small bowel obstruction For exploratory laparotomy later today by general surgery  *Chronic controlled diabetes mellitus type 2  Well-controlled Hemoglobin A1c typically between 5-6 per family Continue tohold metformin,continue SSI w/Accu-Cheks every a.m., IV fluids for rehydration  *Acute probable UTI Stable Treated with 3-day course of Rocephin   *History of asthma without exacerbation Stable BTs prn  *Chronic anemia Stable Nonactive issue  *History of kidney stones  stable Nonactive  issue   All the records are reviewed and case discussed with Care Management/Social Workerr. Management plans discussed with the patient, family and they are in agreement.  CODE STATUS: full TOTAL TIME TAKING CARE OF THIS PATIENT: 35 minutes.     POSSIBLE D/C IN 4-7 DAYS, DEPENDING ON CLINICAL CONDITION.   Evelena Asa Salary M.D on 08/14/2017   Between 7am to 6pm - Pager - [phone]  After 6pm go to [email or web address] - password [email or web address]  [facility]  Office  [phone]  CC: Primary care physician; Dorothey Baseman, MD  Note: This dictation was prepared with Dragon dictation along with smaller phrase technology. Any transcriptional errors that result from this process are unintentional.

## 2017-08-14 NOTE — Anesthesia Preprocedure Evaluation (Signed)
Anesthesia Evaluation  Patient identified by MRN, date of birth, ID band Patient awake    Reviewed: Allergy & Precautions, H&P , NPO status , reviewed documented beta blocker date and time   History of Anesthesia Complications (+) PONV and history of anesthetic complications  Airway Mallampati: II  TM Distance: >3 FB Neck ROM: full    Dental  (+) Teeth Intact   Pulmonary asthma , former smoker,    Pulmonary exam normal        Cardiovascular Normal cardiovascular exam     Neuro/Psych    GI/Hepatic   Endo/Other  diabetes  Renal/GU Renal disease     Musculoskeletal  (+) Arthritis ,   Abdominal   Peds  Hematology  (+) anemia ,   Anesthesia Other Findings Past Medical History: No date: Abdominal pain, generalized No date: Anemia No date: Asthma No date: Complication of anesthesia 10/09/2014: DDD (degenerative disc disease), lumbar 07/25/2013: Diabetes (HCC) No date: Diabetes mellitus without complication (HCC) No date: Hyperlipidemia 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 date: IBS (irritable bowel syndrome) No date: Ileus (HCC) 01/01/2015: 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 No date: Kidney stone 06/06/2012: Low urine output 10/09/2014: Numbness in right leg No date: Plantar fasciitis No date: PONV (postoperative nausea and vomiting) No date: Renal insufficiency 10/22/2015: Sacroiliac joint pain No date: SBO (small bowel obstruction) (HCC) 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 No date: Type 2 diabetes mellitus (HCC) Past Surgical History: No date: ABDOMINAL HYSTERECTOMY No date: APPENDECTOMY No date: CHOLECYSTECTOMY No date: COLONOSCOPY 2016: MICRODISCECTOMY LUMBAR BMI    Body Mass Index:  37.69 kg/m     Reproductive/Obstetrics                             Anesthesia Physical Anesthesia Plan  ASA: III  Anesthesia Plan:  General ETT   Post-op Pain Management:    Induction: Intravenous  PONV Risk Score and Plan: 4 or greater and Ondansetron, Dexamethasone, Midazolam, Metaclopromide and Promethazine  Airway Management Planned:   Additional Equipment:   Intra-op Plan:   Post-operative Plan: Extubation in OR  Informed Consent: I have reviewed the patients History and Physical, chart, labs and discussed the procedure including the risks, benefits and alternatives for the proposed anesthesia with the patient or authorized representative who has indicated his/her understanding and acceptance.   Dental Advisory Given  Plan Discussed with: CRNA  Anesthesia Plan Comments:         Anesthesia Quick Evaluation

## 2017-08-14 NOTE — Progress Notes (Signed)
CC: SBO Subjective: PT  Feeling better , having some flatus.  KUB pers reviewed.  Persistent dilation of the small bowel.  No free air.   Objective: Vital signs in last 24 hours: Temp:  [98.2 F (36.8 C)-98.7 F (37.1 C)] 98.5 F (36.9 C) (06/17 0433) Pulse Rate:  [83-90] 83 (06/17 0433) Resp:  [16-20] 20 (06/17 0433) BP: (150-156)/(63-73) 150/72 (06/17 0433) SpO2:  [97 %-99 %] 99 % (06/17 0433) Last BM Date: 08/13/17  Intake/Output from previous day: 06/16 0701 - 06/17 0700 In: 1715.3 [I.V.:1685.3; NG/GT:30] Out: 850 [Urine:600; Emesis/NG output:250] Intake/Output this shift: No intake/output data recorded.  Physical exam: NAD, awake and alert Abd: soft, NT, no peritonitis, distended. Dec bs  Lab Results: CBC  No results for input(s): WBC, HGB, HCT, PLT in the last 72 hours. BMET Recent Labs    08/13/17 0339 08/14/17 0532  NA 139 142  K 3.9 4.0  CL 105 108  CO2 23 21*  GLUCOSE 81 72  BUN 16 14  CREATININE 1.10* 1.04*  CALCIUM 8.7* 8.8*   PT/INR No results for input(s): LABPROT, INR in the last 72 hours. ABG No results for input(s): PHART, HCO3 in the last 72 hours.  Invalid input(s): PCO2, PO2  Studies/Results: Dg Abd Portable 1v  Result Date: 08/13/2017 CLINICAL DATA:  Follow-up small bowel obstruction. EXAM: PORTABLE ABDOMEN - 1 VIEW COMPARISON:  08/10/17 FINDINGS: The enteric tube is looped within the left upper quadrant of the abdomen. Persistent dilated loops of small bowel measure up to 5.3 cm on today's study. This has increased from 3.5 cm previously. Decreased colonic gas noted. IMPRESSION: 1. Interval progression of small bowel dilatation. Electronically Signed   By: Signa Kellaylor  Stroud M.D.   On: 08/13/2017 17:15   Dg Abd Portable 2v  Result Date: 08/14/2017 CLINICAL DATA:  Concern for small bowel obstruction EXAM: PORTABLE ABDOMEN - 2 VIEW COMPARISON:  08/13/2017; 08/10/2017; CT abdomen pelvis-08/10/2017 FINDINGS: Re-demonstrated moderate gaseous  distention of a moderate length of small bowel within the left mid hemiabdomen with index portion measuring approximately 4.1 cm in diameter, minimally improved compared to the 06/16 examination, previously, 5.3 cm. There is a minimal amount of colonic gas seen within the cecum and ascending colon. No pneumoperitoneum, pneumatosis or portal venous gas. Enteric tube tip and side port projected the expected location of the gastric fundus. No definitive abnormal intra-abdominal calcifications. Limited visualization of lower thorax demonstrates minimal bibasilar opacities, left greater than right, likely atelectasis. No definite acute osseous abnormalities. IMPRESSION: No change to slight improvement in findings again worrisome for persistent at least partial small bowel obstruction. Continued attention on follow-up is recommended. Electronically Signed   By: Simonne ComeJohn  Watts M.D.   On: 08/14/2017 08:08    Anti-infectives: Anti-infectives (From admission, onward)   Start     Dose/Rate Route Frequency Ordered Stop   08/11/17 1600  cefTRIAXone (ROCEPHIN) 1 g in sodium chloride 0.9 % 100 mL IVPB     1 g 200 mL/hr over 30 Minutes Intravenous Every 24 hours 08/11/17 1502 08/13/17 1600      Assessment/Plan: 67 year old female with multiple bouts of recurrent and persistent small bowel obstruction.  She has been here for 4 days now with only partial resolution of symptoms.  A lengthy discussion with the patient regarding the situation.  I do think that she will require surgical therapy.  She wants to go ahead and have this done because she understand that this is persistent and is not completely improving.  As  you discussed with the patient in detail.  Risk benefit and possible complications including but not limited to: Bleeding, infection, injury to the bowel, possibility of bowel resection, pain.  She understands and wishes to proceed.    Sterling Big, MD, [facility]  08/14/2017

## 2017-08-14 NOTE — Anesthesia Procedure Notes (Signed)
Procedure Name: Intubation Date/Time: 08/14/2017 1:22 PM Performed by: Justus Memory, CRNA Pre-anesthesia Checklist: Patient identified, Patient being monitored, Timeout performed, Emergency Drugs available and Suction available Patient Re-evaluated:Patient Re-evaluated prior to induction Oxygen Delivery Method: Circle system utilized Preoxygenation: Pre-oxygenation with 100% oxygen Induction Type: IV induction Ventilation: Mask ventilation without difficulty Laryngoscope Size: Mac and 3 Grade View: Grade II Tube type: Oral Tube size: 7.0 mm Number of attempts: 1 Airway Equipment and Method: Stylet Placement Confirmation: ETT inserted through vocal cords under direct vision,  positive ETCO2 and breath sounds checked- equal and bilateral Secured at: 19 cm Tube secured with: Tape Dental Injury: Teeth and Oropharynx as per pre-operative assessment  Difficulty Due To: Difficult Airway- due to anterior larynx

## 2017-08-14 NOTE — Transfer of Care (Signed)
Immediate Anesthesia Transfer of Care Note  Patient: Octavio GravesDonna B Eller  Procedure(s) Performed: EXPLORATORY LAPAROTOMY (N/A )  Patient Location: PACU  Anesthesia Type:General  Level of Consciousness: sedated  Airway & Oxygen Therapy: Patient Spontanous Breathing and Patient connected to face mask oxygen  Post-op Assessment: Report given to RN and Post -op Vital signs reviewed and stable  Post vital signs: Reviewed and stable  Last Vitals:  Vitals Value Taken Time  BP 139/54 08/14/2017  2:57 PM  Temp 36.2 C 08/14/2017  2:57 PM  Pulse 84 08/14/2017  3:06 PM  Resp 24 08/14/2017  3:06 PM  SpO2 100 % 08/14/2017  3:06 PM  Vitals shown include unvalidated device data.  Last Pain:  Vitals:   08/14/17 1457  TempSrc:   PainSc: Asleep      Patients Stated Pain Goal: 0 (08/13/17 2104)  Complications: No apparent anesthesia complications

## 2017-08-14 NOTE — Op Note (Signed)
PROCEDURES: 1.  lysis of adhesions  Pre-operative Diagnosis: SBO  Post-operative Diagnosis: same  Surgeon: Merri Rayiego F Raley Novicki     Anesthesia: General endotracheal anesthesia  ASA Class: 2   Surgeon: Alexa Bigiego Charels Stambaugh , MD FACS  Anesthesia: Gen. with endotracheal tub  Findings: Dense adhesions from the small bowel to the abdominal wall on from the mesentery to the abdominal wall.   Estimated Blood Loss: 100cc                 Specimens: none       Complications: none                Condition: stable  Procedure Details  The patient was seen again in the Holding Room. The benefits, complications, treatment options, and expected outcomes were discussed with the patient. The risks of bleeding, infection, recurrence of symptoms, failure to resolve symptoms,  bowel injury, any of which could require further surgery were reviewed with the patient.   The patient was taken to Operating Room, identified as Alexa Abbott and the procedure verified.  A Time Out was held and the above information confirmed.  Prior to the induction of general anesthesia, antibiotic prophylaxis was administered. VTE prophylaxis was in place. General endotracheal anesthesia was then administered and tolerated well. After the induction, the abdomen was prepped with Chloraprep and draped in the sterile fashion. The patient was positioned in the supine position.  His midline laparotomy incision was created with a 10 blade knife and electrocautery was used to dissect through subtenons tissue.  The fascia was incised with electrocautery.  Significant adhesions from the small bowel to the abdominal wall was encountered we were extremely careful with the lysis of adhesions and we took those down with Metzenbaums on a knife.  There was also an area of the mesentery was attached to the abdominal wall I will doing this had a small rent some oozing.  This was appropriately controlled with figure-of-eight 2-0 silk  sutures. Generalized of adhesions to be able to free up the small bowel from the abdominal wall and actually had to incise a segment of the fascia to prevent any small bowel resection.  Once we were able to free up the small bowel we run it from the ligament of Treitz all the way to the cecum and there was good perfusion no evidence of any masses or missed injuries. The transition zone was obviously located within the right lower abdominal wall.    We changed gloves and place a new tray to close the abdomen with a -0 PDS suture in a running fashion and the skin was closed with staples. Liposomal Marcaine was injected on all incision sites under direct visualization.     Needle and laparotomy count were correct and there were no immediate complications.  Alexa Bigiego Barb Shear, MD, FACS

## 2017-08-14 NOTE — Care Management Important Message (Signed)
Copy of signed IM left with patient in room.  

## 2017-08-15 ENCOUNTER — Encounter: Payer: Self-pay | Admitting: Surgery

## 2017-08-15 LAB — GLUCOSE, CAPILLARY
GLUCOSE-CAPILLARY: 135 mg/dL — AB (ref 65–99)
GLUCOSE-CAPILLARY: 150 mg/dL — AB (ref 65–99)
GLUCOSE-CAPILLARY: 103 mg/dL — AB (ref 65–99)
Glucose-Capillary: 125 mg/dL — ABNORMAL HIGH (ref 65–99)

## 2017-08-15 LAB — BASIC METABOLIC PANEL
ANION GAP: 8 (ref 5–15)
BUN: 13 mg/dL (ref 6–20)
CALCIUM: 8.2 mg/dL — AB (ref 8.9–10.3)
CO2: 23 mmol/L (ref 22–32)
CREATININE: 1 mg/dL (ref 0.44–1.00)
Chloride: 108 mmol/L (ref 101–111)
GFR calc Af Amer: >60 mL/min (ref >60)
GFR, EST NON AFRICAN AMERICAN: 57 mL/min — AB (ref >60)
Glucose, Bld: 164 mg/dL — ABNORMAL HIGH (ref 65–99)
Potassium: 4.2 mmol/L (ref 3.5–5.1)
SODIUM: 139 mmol/L (ref 135–145)

## 2017-08-15 LAB — CBC
HEMATOCRIT: 35.6 % (ref 35.0–47.0)
Hemoglobin: 12.7 g/dL (ref 12.0–16.0)
MCH: 31.6 pg (ref 26.0–34.0)
MCHC: 35.7 g/dL (ref 32.0–36.0)
MCV: 88.3 fL (ref 80.0–100.0)
PLATELETS: 184 10*3/uL (ref 150–440)
RBC: 4.03 MIL/uL (ref 3.80–5.20)
RDW: 13 % (ref 11.5–14.5)
WBC: 6.9 10*3/uL (ref 3.6–11.0)

## 2017-08-15 MED ORDER — GABAPENTIN 600 MG PO TABS
300.0000 mg | ORAL_TABLET | Freq: Three times a day (TID) | ORAL | Status: DC
  Filled 2017-08-15: qty 1

## 2017-08-15 MED ORDER — OXYCODONE HCL 5 MG PO TABS
10.0000 mg | ORAL_TABLET | ORAL | Status: DC | PRN
  Administered 2017-08-15 (×2): 10 mg via ORAL
  Filled 2017-08-15 (×2): qty 2

## 2017-08-15 NOTE — Progress Notes (Signed)
Patient is postop day 1  exploratory  laparotomy . Patient has no acute event overnight, VSS, pain is being controlled by PRN pain med.Will continue to monitor

## 2017-08-15 NOTE — Progress Notes (Signed)
Initial Nutrition Assessment  DOCUMENTATION CODES:   Obesity unspecified  INTERVENTION:   Recommend Ensure Enlive po BID when diet advanced, each supplement provides 350 kcal and 20 grams of protein  Recommend daily MVI  NUTRITION DIAGNOSIS:   Inadequate oral intake related to acute illness as evidenced by other (comment)(pt on NPO/ clear liquid diet x 5 days).  GOAL:   Patient will meet greater than or equal to 90% of their needs  MONITOR:   PO intake, Supplement acceptance, Labs, Weight trends, Skin, I & O's  REASON FOR ASSESSMENT:   NPO/Clear Liquid Diet    ASSESSMENT:   6/18- 67 y/o female with h/o multiple recurring SBO's, IBS, DM, diverticulitis admitted with SBO now s/p ex lap and lysis of adhesions 6/17   Met with pt in room today. Pt reports good appetite and oral intake up until 1-2 days pta. Pt has been NPO since admit (x 5 days) and initiated on clear liquids today. Pt eating broth at time of RD visit today; pt reports no nausea or vomiting since last Thursday but reports abdominal cramps today. Pt is passing some flatus today but denies any bowel movements. Pt noted in chart to have liquid BM yesterday. Pt reports an intentional 35lb weight loss over the past 1-2 years. Per chart, pt appears weight stable for the past year. RD discussed with pt the importance of adequate protein and calories needed for post op healing and to preserve lean muscle. Pt is willing to try ONS with diet advancement. Pt concerned today because her blood sugars are high; has been requesting sugar free tea and jello with trays and giving juice and pocicles to her husband. RD encouraged pt to eat the food coming on her trays; explained that she still needs energy for brain function and healing. Explained the effects of stress on her blood sugar levels. Recommend Ensure with diet advancement; pt likely at low refeeding risk as she is on dextrose with her IVFs.    Medications reviewed and include:  lovenox, insulin, LRS w/ 5% dextrose '@125ml'$ /hr, pepcid, morphine  Labs reviewed: K 4.2 wnl cbgs- 110, 110, 183, 135, 150 x 24hrs  NUTRITION - FOCUSED PHYSICAL EXAM:    Most Recent Value  Orbital Region  No depletion  Upper Arm Region  No depletion  Thoracic and Lumbar Region  No depletion  Buccal Region  No depletion  Temple Region  No depletion  Clavicle Bone Region  No depletion  Clavicle and Acromion Bone Region  No depletion  Scapular Bone Region  No depletion  Dorsal Hand  No depletion  Patellar Region  No depletion  Anterior Thigh Region  No depletion  Posterior Calf Region  No depletion  Edema (RD Assessment)  None  Hair  Reviewed  Eyes  Reviewed  Mouth  Reviewed  Skin  Reviewed  Nails  Reviewed     Diet Order:   Diet Order           Diet clear liquid Room service appropriate? Yes; Fluid consistency: Thin  Diet effective now         EDUCATION NEEDS:   Education needs have been addressed  Skin:  Skin Assessment: Reviewed RN Assessment(incision abdomen )  Last BM:  6/17  Height:   Ht Readings from Last 1 Encounters:  08/14/17 5' (1.524 m)    Weight:   Wt Readings from Last 1 Encounters:  08/14/17 193 lb (87.5 kg)    Ideal Body Weight:  45.45 kg  BMI:  Body mass index is 37.69 kg/m.  Estimated Nutritional Needs:   Kcal:  1600-1800kcal/day   Protein:  88-97g/day   Fluid:  >1.4L/day   Koleen Distance MS, RD, LDN Pager #- [phone] Office#- [phone] After Hours Pager: [phone]

## 2017-08-15 NOTE — Progress Notes (Signed)
[facility] [place] [facility]   PATIENT NAME: Alexa Abbott    MR#:  [id number]  DATE OF BIRTH:  Jun 14, 1950  SUBJECTIVE:  CHIEF COMPLAINT:   Chief Complaint  Patient presents with  . Abdominal Pain   The patient has no complaints, NGT was removed this morning, she tolerated clear liquid diet.  Status post laparotomy yesterday.  REVIEW OF SYSTEMS:  CONSTITUTIONAL: No fever, fatigue or weakness.  EYES: No blurred or double vision.  EARS, NOSE, AND THROAT: No tinnitus or ear pain.  RESPIRATORY: No cough, shortness of breath, wheezing or hemoptysis.  CARDIOVASCULAR: No chest pain, orthopnea, edema.  GASTROINTESTINAL: No nausea, vomiting, diarrhea or abdominal pain.  GENITOURINARY: No dysuria, hematuria.  ENDOCRINE: No polyuria, nocturia,  HEMATOLOGY: No anemia, easy bruising or bleeding SKIN: No rash or lesion. MUSCULOSKELETAL: No joint pain or arthritis.   NEUROLOGIC: No tingling, numbness, weakness.  PSYCHIATRY: No anxiety or depression.   ROS  DRUG ALLERGIES:   Allergies  Allergen Reactions  . Adhesive [Tape] Dermatitis  . Codeine Itching  . Penicillins Rash    Has patient had a PCN reaction causing immediate rash, facial/tongue/throat swelling, SOB or lightheadedness with hypotension: yes Has patient had a PCN reaction causing severe rash involving mucus membranes or skin necrosis: no Has patient had a PCN reaction that required hospitalization no Has patient had a PCN reaction occurring within the last 10 years: no If all of the above answers are "NO", then may proceed with Cephalosporin use.     VITALS:  Blood pressure (!) 114/54, pulse 80, temperature 97.8 F (36.6 C), temperature source Oral, resp. rate 19, height 5' (1.524 m), weight 193 lb (87.5 kg), SpO2 99 %.  PHYSICAL EXAMINATION:  GENERAL:  67 y.o.-year-old patient lying in the bed with no acute distress.  EYES: Pupils equal, round, reactive to light and accommodation. No scleral  icterus. Extraocular muscles intact.  HEENT: Head atraumatic, normocephalic. Oropharynx and nasopharynx clear.  NECK:  Supple, no jugular venous distention. No thyroid enlargement, no tenderness.  LUNGS: Normal breath sounds bilaterally, no wheezing, rales,rhonchi or crepitation. No use of accessory muscles of respiration.  CARDIOVASCULAR: S1, S2 normal. No murmurs, rubs, or gallops.  ABDOMEN: Soft, nontender, nondistended. Bowel sounds present. No organomegaly or mass.  EXTREMITIES: No pedal edema, cyanosis, or clubbing.  NEUROLOGIC: Cranial nerves II through XII are intact. Muscle strength 5/5 in all extremities. Sensation intact. Gait not checked.  PSYCHIATRIC: The patient is alert and oriented x 3.  SKIN: No obvious rash, lesion, or ulcer.   Physical Exam LABORATORY PANEL:   CBC Recent Labs  Lab 08/15/17 0323  WBC 6.9  HGB 12.7  HCT 35.6  PLT 184   ------------------------------------------------------------------------------------------------------------------  Chemistries  Recent Labs  Lab 08/10/17 1851  08/15/17 0323  NA 140   < > 139  K 4.3   < > 4.2  CL 101   < > 108  CO2 28   < > 23  GLUCOSE 123*   < > 164*  BUN 18   < > 13  CREATININE 1.09*   < > 1.00  CALCIUM 10.2   < > 8.2*  AST 21  --   --   ALT 22  --   --   ALKPHOS 79  --   --   BILITOT 0.8  --   --    < > = values in this interval not displayed.   ------------------------------------------------------------------------------------------------------------------  Cardiac Enzymes Recent Labs  Lab 08/13/17  1610  TROPONINI <0.03   ------------------------------------------------------------------------------------------------------------------  RADIOLOGY:  Dg Abd Portable 2v  Result Date: 08/14/2017 CLINICAL DATA:  Concern for small bowel obstruction EXAM: PORTABLE ABDOMEN - 2 VIEW COMPARISON:  08/13/2017; 08/10/2017; CT abdomen pelvis-08/10/2017 FINDINGS: Re-demonstrated moderate gaseous  distention of a moderate length of small bowel within the left mid hemiabdomen with index portion measuring approximately 4.1 cm in diameter, minimally improved compared to the 06/16 examination, previously, 5.3 cm. There is a minimal amount of colonic gas seen within the cecum and ascending colon. No pneumoperitoneum, pneumatosis or portal venous gas. Enteric tube tip and side port projected the expected location of the gastric fundus. No definitive abnormal intra-abdominal calcifications. Limited visualization of lower thorax demonstrates minimal bibasilar opacities, left greater than right, likely atelectasis. No definite acute osseous abnormalities. IMPRESSION: No change to slight improvement in findings again worrisome for persistent at least partial small bowel obstruction. Continued attention on follow-up is recommended. Electronically Signed   By: Simonne Come M.D.   On: 08/14/2017 08:08    ASSESSMENT AND PLAN:  *Acute recurrent partial small bowel obstruction For exploratory laparotomy later today by general surgery  *Chronic controlled diabetes mellitus type 2  Well-controlled Hemoglobin A1c typically between 5-6 per family Continue tohold metformin,continue SSI w/Accu-Cheks every a.m., IV fluids for rehydration  *Acute probable UTI Stable Treated with 3-day course of Rocephin   *History of asthma without exacerbation Stable BTs prn  *Chronic anemia Stable Nonactive issue  *History of kidney stones  stable Nonactive issue   All the records are reviewed and case discussed with Care Management/Social Workerr. Management plans discussed with the patient, her husband and they are in agreement.  CODE STATUS: full TOTAL TIME TAKING CARE OF THIS PATIENT: 28 minutes.     POSSIBLE D/C IN 1-2 DAYS, DEPENDING ON CLINICAL CONDITION.   Shaune Pollack M.D on 08/15/2017   Between 7am to 6pm - Pager - [phone]  After 6pm go to [email or web address] - password [email or web address]  [facility]  Office  [phone]  CC: Primary care physician; Dorothey Baseman, MD  Note: This dictation was prepared with Dragon dictation along with smaller phrase technology. Any transcriptional errors that result from this process are unintentional.

## 2017-08-15 NOTE — Progress Notes (Signed)
POD # 1 AVSS + flatus Pain well controlled  PE NAD Abd: dressing intact, no peritonitis or infection  A/p Doing well DC foley, ngt Mobilize Clears later today

## 2017-08-15 NOTE — Plan of Care (Signed)
  Problem: Education: Goal: Knowledge of General Education information will improve Outcome: Progressing   Problem: Health Behavior/Discharge Planning: Goal: Ability to manage health-related needs will improve Outcome: Progressing   Problem: Clinical Measurements: Goal: Ability to maintain clinical measurements within normal limits will improve Outcome: Progressing Goal: Will remain free from infection Outcome: Progressing Goal: Diagnostic test results will improve Outcome: Progressing Goal: Respiratory complications will improve Outcome: Progressing Goal: Cardiovascular complication will be avoided Outcome: Progressing   Problem: Activity: Goal: Risk for activity intolerance will decrease Outcome: Progressing   Problem: Nutrition: Goal: Adequate nutrition will be maintained Outcome: Progressing   Problem: Coping: Goal: Level of anxiety will decrease Outcome: Progressing   Problem: Elimination: Goal: Will not experience complications related to bowel motility Outcome: Progressing Goal: Will not experience complications related to urinary retention Outcome: Progressing   Problem: Pain Managment: Goal: General experience of comfort will improve Outcome: Progressing   Problem: Safety: Goal: Ability to remain free from injury will improve Outcome: Progressing   Problem: Skin Integrity: Goal: Risk for impaired skin integrity will decrease Outcome: Progressing   Problem: Clinical Measurements: Goal: Postoperative complications will be avoided or minimized Outcome: Progressing   Problem: Skin Integrity: Goal: Demonstration of wound healing without infection will improve Outcome: Progressing   

## 2017-08-16 LAB — GLUCOSE, CAPILLARY: GLUCOSE-CAPILLARY: 92 mg/dL (ref 65–99)

## 2017-08-16 MED ORDER — OXYCODONE-ACETAMINOPHEN 7.5-325 MG PO TABS: 1.0000 | ORAL_TABLET | ORAL | 0 refills | Status: DC | PRN

## 2017-08-16 NOTE — Discharge Instructions (Signed)
Small Bowel Obstruction A small bowel obstruction is a blockage in the small bowel. The small bowel, which is also called the small intestine, is a Pelley, slender tube that connects the stomach to the colon. When a person eats and drinks, food and fluids go from the stomach to the small bowel. This is where most of the nutrients in the food and fluids are absorbed. A small bowel obstruction will prevent food and fluids from passing through the small bowel as they normally do during digestion. The small bowel can become partially or completely blocked. This can cause symptoms such as abdominal pain, vomiting, and bloating. If this condition is not treated, it can be dangerous because the small bowel could rupture. What are the causes? Common causes of this condition include:  Scar tissue from previous surgery or radiation treatment.  Recent surgery. This may cause the movements of the bowel to slow down and cause food to block the intestine.  Hernias.  Inflammatory bowel disease (colitis).  Twisting of the bowel (volvulus).  Tumors.  A foreign body.  Slipping of a part of the bowel into another part (intussusception).  What are the signs or symptoms? Symptoms of this condition include:  Abdominal pain. This may be dull cramps or sharp pain. It may occur in one area, or it may be present in the entire abdomen. Pain can range from mild to severe, depending on the degree of obstruction.  Nausea and vomiting. Vomit may be greenish or a yellow bile color.  Abdominal bloating.  Constipation.  Lack of passing gas.  Frequent belching.  Diarrhea. This may occur if the obstruction is partial and runny stool is able to leak around the obstruction.  How is this diagnosed? This condition may be diagnosed based on a physical exam, medical history, and X-rays of the abdomen. You may also have other tests, such as a CT scan of the abdomen and pelvis. How is this treated? Treatment for this  condition depends on the cause and severity of the problem. Treatment options may include:  Bed rest along with fluids and pain medicines that are given through an IV tube inserted into one of your veins. Sometimes, this is all that is needed for the obstruction to improve.  Following a simple diet. In some cases, a clear liquid diet may be required for several days. This allows the bowel to rest.  Placement of a small tube (nasogastric tube) into the stomach. When the bowel is blocked, it usually swells up like a balloon that is filled with air and fluids. The air and fluids may be removed by suction through the nasogastric tube. This can help with pain, discomfort, and nausea. It can also help the obstruction to clear up faster.  Surgery. This may be required if other treatments do not work. Bowel obstruction from a hernia may require early surgery and can be an emergency procedure. Surgery may also be required for scar tissue that causes frequent or severe obstructions.  Follow these instructions at home:  Get plenty of rest.  Follow instructions from your health care provider about eating restrictions. You may need to avoid solid foods and consume only clear liquids until your condition improves.  Take over-the-counter and prescription medicines only as told by your health care provider.  Keep all follow-up visits as told by your health care provider. This is important. Contact a health care provider if:  You have a fever.  You have chills. Get help right away if:  You have increased pain or cramping.  You vomit blood.  You have uncontrolled vomiting or nausea.  You cannot drink fluids because of vomiting or pain.  You develop confusion.  You begin feeling very dry or thirsty (dehydrated).  You have severe bloating.  You feel extremely weak or you faint.  Open Small Bowel Resection Small bowel resection is surgery to remove part of the small bowel. The small bowel, also  called the small intestine, is the top part of the intestines. It is part of the digestive system. When food leaves the stomach, it goes into the small bowel. Most food is then absorbed into the body. A small bowel resection may be needed if the small bowel becomes blocked or harmed by disease. One type of procedure is called an open resection. An open surgery means that the surgeon will make a long incision to open up your abdomen for the procedure. Tell a health care provider about: Any allergies you have. All medicines you are taking, including vitamins, herbs, eye drops, creams, and over-the-counter medicines. Any problems you or family members have had with anesthetic medicines. Any blood disorders you have. Any surgeries you have had. Any medical conditions you have. Whether you are pregnant or may be pregnant. What are the risks? Generally, this is a safe procedure. However, problems may occur, including: Bleeding. Infection. Allergic reactions to medicines. Damage to other structures or organs. A blood clot that forms somewhere in the veins and travels to the lung (pulmonary embolism). Leaking of intestinal fluids into the abdomen. A hernia. This occurs when the abdomen bulges out. It may require surgery in the future. Scarring where the incision is made or inside your body, around the intestines. If this occurs, surgery may be required in the future. Not being able to absorb enough vitamins and nutrition through the small bowel.  What happens before the procedure? Staying hydrated Follow instructions from your health care provider about hydration, which may include: Up to 2 hours before the procedure - you may continue to drink clear liquids, such as water, clear fruit juice, black coffee, and plain tea.  Eating and drinking restrictions Follow instructions from your health care provider about eating and drinking, which may include: 8 hours before the procedure - stop eating  heavy meals or foods such as meat, fried foods, or fatty foods. 6 hours before the procedure - stop eating light meals or foods, such as toast or cereal. 6 hours before the procedure - stop drinking milk or drinks that contain milk. 2 hours before the procedure - stop drinking clear liquids.  Medicines Ask your health care provider about: Changing or stopping your regular medicines. This is especially important if you are taking diabetes medicines or blood thinners. Taking medicines such as aspirin and ibuprofen. These medicines can thin your blood. Do not take these medicines before your procedure if your health care provider instructs you not to. You may be given antibiotic medicine to help prevent infection. You may need to take medicine to clean out your bowels before surgery (bowel prep). General instructions Ask your health care provider how your surgical site will be marked or identified. You may be asked to shower with a germ-killing soap. You may have testing, such as blood tests, X-rays, or other imaging scans that take pictures of the small bowel. Plan to have someone take you home from the hospital or clinic. Arrange for someone to help you with your activities during recovery. What happens during the  procedure? To lower your risk of infection: Your health care team will wash or sanitize their hands. Your skin will be washed with soap. Hair may be removed from the surgical area. An IV tube will be inserted into one of your veins. Medicine will flow directly into your body through the IV tube. You will be given one or both of the following: A medicine to help you relax (sedative). A medicine to make you fall asleep (general anesthetic). Several tubes may be put into your body. A tube in your throat will help you breathe during the procedure. It may also be used to give you anesthetic gas during the procedure. A nasogastric tube will go through your nose and into your  stomach.Fluids from your stomach will drain through this tube during and after the procedure. A thin, flexible tube (catheter) in your bladder will drain urine during and after the procedure. An incision will be made in the middle of your abdomen. The surgeon will remove the affected piece of small bowel and then join the bowel together again with staples or stitches (sutures). This will allow digested food to pass through again. The surgeon will close the incision with staples or sutures. The procedure may vary among health care providers and hospitals. What happens after the procedure? Your blood pressure, heart rate, breathing rate, and blood oxygen level will be monitored until the medicines you were given have worn off. You will be given medicine for pain as needed. You will continue to get fluids through the IV tube for a while. Also, the nasogastric tube may stay in for a few days until your bowels are working again. After the nasogastric tube is out, you can start eating food again. You will start with liquids and then advance to more solid foods. You will be asked to get up and start walking within a day. This helps to keep blood clots from forming in your legs. You may be told to breathe deeply and to cough now and then.This keeps your airways open. This information is not intended to replace advice given to you by your health care provider. Make sure you discuss any questions you have with your health care provider. Document Released: 07/19/2010 Document Revised: 11/24/2015 Document Reviewed: 11/16/2015 Elsevier Interactive Patient Education  [employer]2018 [employer]   This information is not intended to replace advice given to you by your health care provider. Make sure you discuss any questions you have with your health care provider. Document Released: 05/03/2005 Document Revised: 10/12/2015 Document Reviewed: 04/10/2014 Elsevier Interactive Patient Education  [employer]2018 [employer]

## 2017-08-16 NOTE — Progress Notes (Signed)
[facility]   PATIENT NAME: Alexa Abbott    MR#:  [id number]  DATE OF BIRTH:  Apr 25, 1950  SUBJECTIVE:  CHIEF COMPLAINT:   Chief Complaint  Patient presents with  . Abdominal Pain   The patient has no complaints, She tolerated liquid diet.  Status post laparotomy POD2.  REVIEW OF SYSTEMS:  CONSTITUTIONAL: No fever, fatigue or weakness.  EYES: No blurred or double vision.  EARS, NOSE, AND THROAT: No tinnitus or ear pain.  RESPIRATORY: No cough, shortness of breath, wheezing or hemoptysis.  CARDIOVASCULAR: No chest pain, orthopnea, edema.  GASTROINTESTINAL: No nausea, vomiting, diarrhea or abdominal pain.  GENITOURINARY: No dysuria, hematuria.  ENDOCRINE: No polyuria, nocturia,  HEMATOLOGY: No anemia, easy bruising or bleeding SKIN: No rash or lesion. MUSCULOSKELETAL: No joint pain or arthritis.   NEUROLOGIC: No tingling, numbness, weakness.  PSYCHIATRY: No anxiety or depression.   ROS  DRUG ALLERGIES:   Allergies  Allergen Reactions  . Adhesive [Tape] Dermatitis  . Codeine Itching  . Penicillins Rash    Has patient had a PCN reaction causing immediate rash, facial/tongue/throat swelling, SOB or lightheadedness with hypotension: yes Has patient had a PCN reaction causing severe rash involving mucus membranes or skin necrosis: no Has patient had a PCN reaction that required hospitalization no Has patient had a PCN reaction occurring within the last 10 years: no If all of the above answers are "NO", then may proceed with Cephalosporin use.     VITALS:  Blood pressure (!) 133/52, pulse 81, temperature 98.3 F (36.8 C), temperature source Oral, resp. rate 20, height 5' (1.524 m), weight 193 lb (87.5 kg), SpO2 99 %.  PHYSICAL EXAMINATION:  GENERAL:  67 y.o.-year-old patient lying in the bed with no acute distress.  EYES: Pupils equal, round, reactive to light and accommodation. No scleral icterus. Extraocular muscles intact.   HEENT: Head atraumatic, normocephalic. Oropharynx and nasopharynx clear.  NECK:  Supple, no jugular venous distention. No thyroid enlargement, no tenderness.  LUNGS: Normal breath sounds bilaterally, no wheezing, rales,rhonchi or crepitation. No use of accessory muscles of respiration.  CARDIOVASCULAR: S1, S2 normal. No murmurs, rubs, or gallops.  ABDOMEN: Soft, nontender, nondistended. Bowel sounds present. No organomegaly or mass.  EXTREMITIES: No pedal edema, cyanosis, or clubbing.  NEUROLOGIC: Cranial nerves II through XII are intact. Muscle strength 5/5 in all extremities. Sensation intact. Gait not checked.  PSYCHIATRIC: The patient is alert and oriented x 3.  SKIN: No obvious rash, lesion, or ulcer.   Physical Exam LABORATORY PANEL:   CBC Recent Labs  Lab 08/15/17 0323  WBC 6.9  HGB 12.7  HCT 35.6  PLT 184   ------------------------------------------------------------------------------------------------------------------  Chemistries  Recent Labs  Lab 08/10/17 1851  08/15/17 0323  NA 140   < > 139  K 4.3   < > 4.2  CL 101   < > 108  CO2 28   < > 23  GLUCOSE 123*   < > 164*  BUN 18   < > 13  CREATININE 1.09*   < > 1.00  CALCIUM 10.2   < > 8.2*  AST 21  --   --   ALT 22  --   --   ALKPHOS 79  --   --   BILITOT 0.8  --   --    < > = values in this interval not displayed.   ------------------------------------------------------------------------------------------------------------------  Cardiac Enzymes Recent Labs  Lab 08/13/17 0339  TROPONINI <0.03   ------------------------------------------------------------------------------------------------------------------  RADIOLOGY:  No results found.  ASSESSMENT AND PLAN:  *Acute recurrent partial small bowel obstruction S/P laparotomy POD2. She had BM.  *Chronic controlled diabetes mellitus type 2  Well-controlled Hemoglobin A1c typically between 5-6 per family resume metformin after discharge,  continue SSI w/Accu-Cheks every a.m., IV fluids for rehydration  *Acute probable UTI Stable Treated with 3-day course of Rocephin   *History of asthma without exacerbation Stable BTs prn  *Chronic anemia Stable Nonactive issue  *History of kidney stones  stable Nonactive issue  Medically stable, sign off. All the records are reviewed and case discussed with Care Management/Social Workerr. Management plans discussed with the patient, her husband and they are in agreement.  CODE STATUS: full TOTAL TIME TAKING CARE OF THIS PATIENT: 20 minutes.   POSSIBLE D/C today, DEPENDING ON CLINICAL CONDITION.   Shaune Pollack M.D on 08/16/2017   Between 7am to 6pm - Pager - [phone]  After 6pm go to [email or web address] - password [email or web address]  [facility]  Office  [phone]  CC: Primary care physician; Dorothey Baseman, MD  Note: This dictation was prepared with Dragon dictation along with smaller phrase technology. Any transcriptional errors that result from this process are unintentional.

## 2017-08-16 NOTE — Care Management (Signed)
Notified by Cala BradfordKimberly with [facility] that they had received a referral outpatient from [place]'s office.  Per Cala BradfordKimberly no additional orders needed and she is aware of discharge. RNCM signing off.

## 2017-08-16 NOTE — Anesthesia Postprocedure Evaluation (Signed)
Anesthesia Post Note  Patient: Alexa Abbott  Procedure(s) Performed: EXPLORATORY LAPAROTOMY (N/A )  Patient location during evaluation: PACU Anesthesia Type: General Level of consciousness: awake and alert Pain management: pain level controlled Vital Signs Assessment: post-procedure vital signs reviewed and stable Respiratory status: spontaneous breathing, nonlabored ventilation, respiratory function stable and patient connected to nasal cannula oxygen Cardiovascular status: blood pressure returned to baseline and stable Postop Assessment: no apparent nausea or vomiting Anesthetic complications: no     Last Vitals:  Vitals:   08/15/17 2329 08/16/17 0502  BP:  (!) 133/52  Pulse:  81  Resp:  20  Temp: 37.2 C 36.8 C  SpO2:  99%    Last Pain:  Vitals:   08/16/17 0502  TempSrc: Oral  PainSc:                  Christia ReadingScott T Elinora Weigand

## 2017-08-16 NOTE — Care Management Important Message (Signed)
Copy of signed IM left with patient in room.  

## 2017-08-16 NOTE — Progress Notes (Signed)
Discharge teaching given to patient, patient verbalized understanding and had no questions. Patient IV removed. Patient will be transported home by family. All patient belongings gathered prior to leaving.  

## 2017-08-16 NOTE — Discharge Summary (Signed)
Patient ID: Alexa GravesDonna B Abbott MRN: [id number] DOB/AGE: [birth year] 67 y.o.  Admit date: 08/10/2017 Discharge date: 08/16/2017   Discharge Diagnoses:  Active Problems:   SBO (small bowel obstruction) (HCC)   Procedures:Laparotomy and LOA  Hospital Course: 67 yo female presented w abd pain N/V. W/U including CT revealed SBO. Admitted for IVF, NPO and NGT decompression. Patient improved partially but had persistent dilation of SB. HE was taken to the OR for Laparotomy and LOA> SHe did very well post operatively. At the time of DC she was ambulating, tolerating regular diet, + flatus and + BM. AVSS. PE NAD awake and alert. Abd: soft, incision c/d/i, no infection. No peritonitis. Ext: well perfused and no edema. COndition at the time of DC was stable.   Disposition: Discharge disposition: 01-Home or Self Care       Discharge Instructions    Call MD for:  difficulty breathing, headache or visual disturbances   Complete by:  As directed    Call MD for:  extreme fatigue   Complete by:  As directed    Call MD for:  hives   Complete by:  As directed    Call MD for:  persistant dizziness or light-headedness   Complete by:  As directed    Call MD for:  persistant nausea and vomiting   Complete by:  As directed    Call MD for:  redness, tenderness, or signs of infection (pain, swelling, redness, odor or green/yellow discharge around incision site)   Complete by:  As directed    Call MD for:  severe uncontrolled pain   Complete by:  As directed    Call MD for:  temperature >100.4   Complete by:  As directed    Diet - low sodium heart healthy   Complete by:  As directed    Discharge instructions   Complete by:  As directed    Shower starting today, do not submerge in water   Increase activity slowly   Complete by:  As directed    Lifting restrictions   Complete by:  As directed    20 lbs x 6 wks     Allergies as of 08/16/2017      Reactions   Adhesive [tape] Dermatitis   Codeine Itching   Penicillins Rash   Has patient had a PCN reaction causing immediate rash, facial/tongue/throat swelling, SOB or lightheadedness with hypotension: yes Has patient had a PCN reaction causing severe rash involving mucus membranes or skin necrosis: no Has patient had a PCN reaction that required hospitalization no Has patient had a PCN reaction occurring within the last 10 years: no If all of the above answers are "NO", then may proceed with Cephalosporin use.      Medication List    TAKE these medications   acetaminophen 500 MG tablet Commonly known as:  TYLENOL Take 1,000 mg by mouth every 6 (six) hours as needed for mild pain or headache.   BAYER CONTOUR TEST test strip Generic drug:  glucose blood TEST ONCE D UTD   Biotin 10 MG Tabs Take 1 tablet by mouth every evening.   loratadine 10 MG tablet Commonly known as:  CLARITIN Take 10 mg by mouth daily as needed for allergies.   metFORMIN 500 MG 24 hr tablet Commonly known as:  GLUCOPHAGE-XR Take 500 mg by mouth every evening.   ondansetron 4 MG disintegrating tablet Commonly known as:  ZOFRAN ODT Take 1 tablet (4 mg total) by mouth every 8 (eight)  hours as needed for nausea or vomiting.   oxyCODONE-acetaminophen 7.5-325 MG tablet Commonly known as:  PERCOCET Take 1 tablet by mouth every 4 (four) hours as needed for moderate pain or severe pain (may take up to 2 for severe pain).   PROBIOTIC ACIDOPHILUS PO Take 1 tablet by mouth daily.   ranitidine 150 MG tablet Commonly known as:  ZANTAC Take 150 mg by mouth 2 (two) times daily.      Follow-up Information    Audryana Hockenberry, [place] F, MD Follow up in 2 week(s).   Specialty:  General Surgery Contact information: [address] [place] [address] [phone]            Sterling Big, MD FACS

## 2017-08-17 ENCOUNTER — Telehealth: Payer: Self-pay | Admitting: Surgery

## 2017-08-17 NOTE — Telephone Encounter (Signed)
Referral from [facility] home health - patient refuses any home health - states that she is doing well.

## 2017-08-21 ENCOUNTER — Telehealth: Payer: Self-pay

## 2017-08-21 NOTE — Telephone Encounter (Signed)
Flagged on EMMI report for having questions about discharge papers.  Called and spoke with patient.  She mentioned she does not have any questions or concerns at this time.  I thanked her for her time and mentioned she would receive one more automated call sometime today (08/21/17) as a final check-in.

## 2017-08-30 ENCOUNTER — Ambulatory Visit (INDEPENDENT_AMBULATORY_CARE_PROVIDER_SITE_OTHER): Payer: Medicare Other | Admitting: Surgery

## 2017-08-30 ENCOUNTER — Encounter: Payer: Self-pay | Admitting: Surgery

## 2017-08-30 VITALS — BP 118/80 | HR 92 | Ht 60.0 in | Wt 188.0 lb

## 2017-08-30 DIAGNOSIS — Z09 Encounter for follow-up examination after completed treatment for conditions other than malignant neoplasm: Secondary | ICD-10-CM

## 2017-08-30 NOTE — Patient Instructions (Addendum)
The patient is aware to call back for any questions or concerns. No heavy lifting for 3 weeks Start out at the gym light and gradually increase activity  Follow up as needed  GENERAL POST-OPERATIVE PATIENT INSTRUCTIONS   WOUND CARE INSTRUCTIONS:  Keep a dry clean dressing on the wound if there is drainage. The initial bandage may be removed after 24 hours.  Once the wound has quit draining you may leave it open to air.  If clothing rubs against the wound or causes irritation and the wound is not draining you may cover it with a dry dressing during the daytime.  Try to keep the wound dry and avoid ointments on the wound unless directed to do so.  If the wound becomes bright red and painful or starts to drain infected material that is not clear, please contact your physician immediately.  If the wound is mildly pink and has a thick firm ridge underneath it, this is normal, and is referred to as a healing ridge.  This will resolve over the next 4-6 weeks.  BATHING: You may shower if you have been informed of this by your surgeon. However, Please do not submerge in a tub, hot tub, or pool until incisions are completely sealed or have been told by your surgeon that you may do so.  DIET:  You may eat any foods that you can tolerate.  It is a good idea to eat a high fiber diet and take in plenty of fluids to prevent constipation.  If you do become constipated you may want to take a mild laxative or take ducolax tablets on a daily basis until your bowel habits are regular.  Constipation can be very uncomfortable, along with straining, after recent surgery.  ACTIVITY:  You are encouraged to cough and deep breath or use your incentive spirometer if you were given one, every 15-30 minutes when awake.  This will help prevent respiratory complications and low grade fevers post-operatively if you had a general anesthetic.  You may want to hug a pillow when coughing and sneezing to add additional support to the  surgical area, if you had abdominal or chest surgery, which will decrease pain during these times.  You are encouraged to walk and engage in light activity for the next two weeks.  You should not lift more than 20 pounds, until 09/25/2017 as it could put you at increased risk for complications.  Twenty pounds is roughly equivalent to a plastic bag of groceries. At that time- Listen to your body when lifting, if you have pain when lifting, stop and then try again in a few days. Soreness after doing exercises or activities of daily living is normal as you get back in to your normal routine.  MEDICATIONS:  Try to take narcotic medications and anti-inflammatory medications, such as tylenol, ibuprofen, naprosyn, etc., with food.  This will minimize stomach upset from the medication.  Should you develop nausea and vomiting from the pain medication, or develop a rash, please discontinue the medication and contact your physician.  You should not drive, make important decisions, or operate machinery when taking narcotic pain medication.  SUNBLOCK Use sun block to incision area over the next year if this area will be exposed to sun. This helps decrease scarring and will allow you avoid a permanent darkened area over your incision.  QUESTIONS:  Please feel free to call our office if you have any questions, and we will be glad to assist you. [phone]

## 2017-08-30 NOTE — Progress Notes (Signed)
S/p laparotomy LOA for SBO 6/17 Doing very well No fevers or chills Taking PO, + BMs  PE NAD Abd: staples in place, removed. No infection, no peritonitis. Soft.  A/P Doing well No heavy lifting RTC prn

## 2017-10-04 DIAGNOSIS — K5652 Intestinal adhesions [bands] with complete obstruction: Secondary | ICD-10-CM

## 2018-09-05 ENCOUNTER — Other Ambulatory Visit: Payer: Self-pay | Admitting: Family Medicine

## 2018-09-05 DIAGNOSIS — Z1231 Encounter for screening mammogram for malignant neoplasm of breast: Secondary | ICD-10-CM

## 2018-09-29 DIAGNOSIS — K859 Acute pancreatitis without necrosis or infection, unspecified: Secondary | ICD-10-CM

## 2018-09-29 DIAGNOSIS — G4730 Sleep apnea, unspecified: Secondary | ICD-10-CM

## 2018-09-29 HISTORY — DX: Acute pancreatitis without necrosis or infection, unspecified: K85.90

## 2018-09-29 HISTORY — DX: Sleep apnea, unspecified: G47.30

## 2018-10-25 ENCOUNTER — Ambulatory Visit: Payer: Medicare Other | Attending: Neurology

## 2018-10-29 ENCOUNTER — Other Ambulatory Visit: Payer: Self-pay

## 2018-11-12 ENCOUNTER — Ambulatory Visit
Admission: RE | Admit: 2018-11-12 | Discharge: 2018-11-12 | Disposition: A | Discharge status: Home / Self Care | Payer: Medicare Other | Source: Ambulatory Visit | Attending: Family Medicine | Admitting: Family Medicine

## 2018-11-12 DIAGNOSIS — Z1231 Encounter for screening mammogram for malignant neoplasm of breast: Secondary | ICD-10-CM | POA: Diagnosis present

## 2018-11-20 ENCOUNTER — Other Ambulatory Visit: Payer: Self-pay | Admitting: Student

## 2018-11-20 DIAGNOSIS — K8500 Idiopathic acute pancreatitis without necrosis or infection: Secondary | ICD-10-CM

## 2018-11-20 DIAGNOSIS — R1013 Epigastric pain: Secondary | ICD-10-CM

## 2018-12-03 ENCOUNTER — Ambulatory Visit
Admission: RE | Admit: 2018-12-03 | Discharge: 2018-12-03 | Disposition: A | Discharge status: Home / Self Care | Payer: Medicare Other | Source: Ambulatory Visit | Attending: Student | Admitting: Student

## 2018-12-03 ENCOUNTER — Other Ambulatory Visit: Payer: Self-pay | Admitting: Student

## 2018-12-03 ENCOUNTER — Other Ambulatory Visit: Payer: Self-pay

## 2018-12-03 DIAGNOSIS — K8500 Idiopathic acute pancreatitis without necrosis or infection: Secondary | ICD-10-CM | POA: Diagnosis present

## 2018-12-03 DIAGNOSIS — R1013 Epigastric pain: Secondary | ICD-10-CM

## 2018-12-03 MED ORDER — GADOBUTROL 1 MMOL/ML IV SOLN
8.0000 mL | Freq: Once | INTRAVENOUS | Status: AC | PRN
  Administered 2018-12-03: 8 mL via INTRAVENOUS

## 2018-12-14 ENCOUNTER — Encounter: Payer: Self-pay | Admitting: *Deleted

## 2018-12-18 ENCOUNTER — Other Ambulatory Visit: Payer: Self-pay

## 2018-12-18 ENCOUNTER — Other Ambulatory Visit
Admission: RE | Admit: 2018-12-18 | Discharge: 2018-12-18 | Disposition: A | Discharge status: Home / Self Care | Payer: Medicare Other | Source: Ambulatory Visit | Attending: Ophthalmology | Admitting: Ophthalmology

## 2018-12-18 DIAGNOSIS — Z20828 Contact with and (suspected) exposure to other viral communicable diseases: Secondary | ICD-10-CM | POA: Diagnosis not present

## 2018-12-18 DIAGNOSIS — Z01812 Encounter for preprocedural laboratory examination: Secondary | ICD-10-CM | POA: Diagnosis present

## 2018-12-18 LAB — SARS CORONAVIRUS 2 (TAT 6-24 HRS): SARS Coronavirus 2: NEGATIVE

## 2018-12-21 ENCOUNTER — Encounter
Admission: RE | Disposition: A | Discharge status: Home / Self Care | Payer: Self-pay | Source: Home / Self Care | Attending: Ophthalmology

## 2018-12-21 ENCOUNTER — Ambulatory Visit
Admission: RE | Admit: 2018-12-21 | Discharge: 2018-12-21 | Disposition: A | Discharge status: Home / Self Care | Payer: Medicare Other | Attending: Ophthalmology | Admitting: Ophthalmology

## 2018-12-21 ENCOUNTER — Ambulatory Visit: Payer: Medicare Other | Admitting: Anesthesiology

## 2018-12-21 ENCOUNTER — Encounter: Payer: Self-pay | Admitting: Anesthesiology

## 2018-12-21 DIAGNOSIS — Z885 Allergy status to narcotic agent status: Secondary | ICD-10-CM | POA: Diagnosis not present

## 2018-12-21 DIAGNOSIS — Z79899 Other long term (current) drug therapy: Secondary | ICD-10-CM | POA: Diagnosis not present

## 2018-12-21 DIAGNOSIS — K219 Gastro-esophageal reflux disease without esophagitis: Secondary | ICD-10-CM | POA: Insufficient documentation

## 2018-12-21 DIAGNOSIS — H2512 Age-related nuclear cataract, left eye: Secondary | ICD-10-CM | POA: Diagnosis not present

## 2018-12-21 DIAGNOSIS — G4730 Sleep apnea, unspecified: Secondary | ICD-10-CM | POA: Insufficient documentation

## 2018-12-21 DIAGNOSIS — Z87891 Personal history of nicotine dependence: Secondary | ICD-10-CM | POA: Diagnosis not present

## 2018-12-21 DIAGNOSIS — E1136 Type 2 diabetes mellitus with diabetic cataract: Secondary | ICD-10-CM | POA: Diagnosis present

## 2018-12-21 DIAGNOSIS — Z880 Allergy status to penicillin: Secondary | ICD-10-CM | POA: Diagnosis not present

## 2018-12-21 DIAGNOSIS — J45909 Unspecified asthma, uncomplicated: Secondary | ICD-10-CM | POA: Insufficient documentation

## 2018-12-21 DIAGNOSIS — G709 Myoneural disorder, unspecified: Secondary | ICD-10-CM | POA: Diagnosis not present

## 2018-12-21 HISTORY — PX: CATARACT EXTRACTION W/PHACO: SHX586

## 2018-12-21 HISTORY — DX: Gastro-esophageal reflux disease without esophagitis: K21.9

## 2018-12-21 LAB — GLUCOSE, CAPILLARY
Glucose-Capillary: 158 mg/dL — ABNORMAL HIGH (ref 70–99)
Glucose-Capillary: 130 mg/dL — ABNORMAL HIGH (ref 70–99)

## 2018-12-21 SURGERY — PHACOEMULSIFICATION, CATARACT, WITH IOL INSERTION
Anesthesia: Monitor Anesthesia Care | Site: Eye | Laterality: Left

## 2018-12-21 MED ORDER — LIDOCAINE HCL (PF) 4 % IJ SOLN
INTRAOCULAR | Status: DC | PRN
  Administered 2018-12-21: 4 mL via OPHTHALMIC

## 2018-12-21 MED ORDER — FENTANYL CITRATE (PF) 100 MCG/2ML IJ SOLN
INTRAMUSCULAR | Status: AC
  Filled 2018-12-21: qty 2

## 2018-12-21 MED ORDER — MOXIFLOXACIN HCL 0.5 % OP SOLN: 1.0000 [drp] | Freq: Once | OPHTHALMIC | Status: DC

## 2018-12-21 MED ORDER — POVIDONE-IODINE 5 % OP SOLN
OPHTHALMIC | Status: DC | PRN
  Administered 2018-12-21: 1 via OPHTHALMIC

## 2018-12-21 MED ORDER — ARMC OPHTHALMIC DILATING DROPS
1.0000 "application " | OPHTHALMIC | Status: AC
  Administered 2018-12-21 (×3): 1 via OPHTHALMIC

## 2018-12-21 MED ORDER — SODIUM CHLORIDE 0.9 % IV SOLN
INTRAVENOUS | Status: DC
  Administered 2018-12-21 (×2): via INTRAVENOUS

## 2018-12-21 MED ORDER — CARBACHOL 0.01 % IO SOLN
INTRAOCULAR | Status: DC | PRN
  Administered 2018-12-21: 0.5 mL via INTRAOCULAR

## 2018-12-21 MED ORDER — PHENYLEPHRINE HCL (PRESSORS) 10 MG/ML IV SOLN
INTRAVENOUS | Status: AC
  Filled 2018-12-21: qty 1

## 2018-12-21 MED ORDER — ARMC OPHTHALMIC DILATING DROPS
OPHTHALMIC | Status: AC
  Filled 2018-12-21: qty 0.5

## 2018-12-21 MED ORDER — ONDANSETRON HCL 4 MG/2ML IJ SOLN
INTRAMUSCULAR | Status: AC
  Filled 2018-12-21: qty 2

## 2018-12-21 MED ORDER — TETRACAINE HCL 0.5 % OP SOLN
OPHTHALMIC | Status: AC
  Administered 2018-12-21: 1 [drp] via OPHTHALMIC
  Filled 2018-12-21: qty 4

## 2018-12-21 MED ORDER — MIDAZOLAM HCL 2 MG/2ML IJ SOLN
INTRAMUSCULAR | Status: AC
  Filled 2018-12-21: qty 2

## 2018-12-21 MED ORDER — MOXIFLOXACIN HCL 0.5 % OP SOLN
OPHTHALMIC | Status: AC
  Filled 2018-12-21: qty 3

## 2018-12-21 MED ORDER — MOXIFLOXACIN HCL 0.5 % OP SOLN
OPHTHALMIC | Status: DC | PRN
  Administered 2018-12-21: 0.2 mL via OPHTHALMIC

## 2018-12-21 MED ORDER — EPINEPHRINE PF 1 MG/ML IJ SOLN
INTRAOCULAR | Status: DC | PRN
  Administered 2018-12-21: 11:00:00 via OPHTHALMIC

## 2018-12-21 MED ORDER — EPHEDRINE SULFATE 50 MG/ML IJ SOLN
INTRAMUSCULAR | Status: AC
  Filled 2018-12-21: qty 1

## 2018-12-21 MED ORDER — ONDANSETRON HCL 4 MG/2ML IJ SOLN
INTRAMUSCULAR | Status: DC | PRN
  Administered 2018-12-21: 4 mg via INTRAVENOUS

## 2018-12-21 MED ORDER — MIDAZOLAM HCL 2 MG/2ML IJ SOLN
INTRAMUSCULAR | Status: DC | PRN
  Administered 2018-12-21 (×3): 0.5 mg via INTRAVENOUS

## 2018-12-21 MED ORDER — TETRACAINE HCL 0.5 % OP SOLN
1.0000 [drp] | Freq: Once | OPHTHALMIC | Status: AC
  Administered 2018-12-21 (×2): 1 [drp] via OPHTHALMIC

## 2018-12-21 MED ORDER — NA CHONDROIT SULF-NA HYALURON 40-17 MG/ML IO SOLN
INTRAOCULAR | Status: DC | PRN
  Administered 2018-12-21: 1 mL via INTRAOCULAR

## 2018-12-21 SURGICAL SUPPLY — 18 items
GLOVE BIO SURGEON STRL SZ8 (GLOVE) ×3 IMPLANT
GLOVE BIOGEL M 6.5 STRL (GLOVE) ×3 IMPLANT
GLOVE SURG LX 8.0 MICRO (GLOVE) ×2
GLOVE SURG LX STRL 8.0 MICRO (GLOVE) ×1 IMPLANT
GOWN STRL REUS W/ TWL LRG LVL3 (GOWN DISPOSABLE) ×2 IMPLANT
GOWN STRL REUS W/TWL LRG LVL3 (GOWN DISPOSABLE) ×4
LABEL CATARACT MEDS ST (LABEL) ×3 IMPLANT
LENS IOL ACRSF IQ TRC 3 15.5 IMPLANT
LENS IOL ACRYSOF IQ TORIC 15.5 ×2 IMPLANT
LENS IOL IQ TORIC 3 15.5 ×1 IMPLANT
PACK CATARACT (MISCELLANEOUS) ×3 IMPLANT
PACK CATARACT BRASINGTON LX (MISCELLANEOUS) ×3 IMPLANT
PACK EYE AFTER SURG (MISCELLANEOUS) ×3 IMPLANT
SOL BSS BAG (MISCELLANEOUS) ×3
SOLUTION BSS BAG (MISCELLANEOUS) ×1 IMPLANT
SYR 5ML LL (SYRINGE) ×3 IMPLANT
WATER STERILE IRR 250ML POUR (IV SOLUTION) ×3 IMPLANT
WIPE NON LINTING 3.25X3.25 (MISCELLANEOUS) ×3 IMPLANT

## 2018-12-21 NOTE — Anesthesia Post-op Follow-up Note (Signed)
Anesthesia QCDR form completed.        

## 2018-12-21 NOTE — Anesthesia Postprocedure Evaluation (Signed)
Anesthesia Post Note  Patient: Alexa Abbott  Procedure(s) Performed: CATARACT EXTRACTION PHACO AND INTRAOCULAR LENS PLACEMENT (IOC) LEFT DIABETIC TORIC LENS (Left Eye)  Patient location during evaluation: Phase II Anesthesia Type: MAC Level of consciousness: awake, oriented, awake and alert and patient cooperative Pain management: pain level controlled Vital Signs Assessment: post-procedure vital signs reviewed and stable Respiratory status: spontaneous breathing Cardiovascular status: stable Postop Assessment: able to ambulate and no apparent nausea or vomiting Anesthetic complications: no     Last Vitals:  Vitals:   12/21/18 0838 12/21/18 1109  BP: 140/71 (!) 144/63  Pulse: 81 73  Resp: 18 16  Temp: 36.4 C 36.8 C  SpO2: 96% 97%    Last Pain:  Vitals:   12/21/18 1109  TempSrc: Temporal  PainSc: 0-No pain                 Malary Aylesworth Fletcher-Harrison

## 2018-12-21 NOTE — Anesthesia Preprocedure Evaluation (Signed)
Anesthesia Evaluation  Patient identified by MRN, date of birth, ID band Patient awake    Reviewed: Allergy & Precautions, NPO status , Patient's Chart, lab work & pertinent test results, reviewed documented beta blocker date and time   History of Anesthesia Complications (+) PONV and history of anesthetic complications  Airway Mallampati: III  TM Distance: >3 FB     Dental  (+) Chipped   Pulmonary asthma , sleep apnea , former smoker,           Cardiovascular      Neuro/Psych  Neuromuscular disease    GI/Hepatic GERD  Controlled,  Endo/Other  diabetes, Type 2  Renal/GU Renal disease     Musculoskeletal  (+) Arthritis ,   Abdominal   Peds  Hematology  (+) anemia ,   Anesthesia Other Findings EKG shows iRbbb. Obese.  Reproductive/Obstetrics                             Anesthesia Physical Anesthesia Plan  ASA: III  Anesthesia Plan: MAC   Post-op Pain Management:    Induction: Intravenous  PONV Risk Score and Plan:   Airway Management Planned:   Additional Equipment:   Intra-op Plan:   Post-operative Plan:   Informed Consent: I have reviewed the patients History and Physical, chart, labs and discussed the procedure including the risks, benefits and alternatives for the proposed anesthesia with the patient or authorized representative who has indicated his/her understanding and acceptance.       Plan Discussed with: CRNA  Anesthesia Plan Comments:         Anesthesia Quick Evaluation

## 2018-12-21 NOTE — Op Note (Signed)
PREOPERATIVE DIAGNOSIS:  Nuclear sclerotic cataract of the left eye.   POSTOPERATIVE DIAGNOSIS:  Nuclear sclerotic cataract of the left eye.   OPERATIVE PROCEDURE: Procedure(s): CATARACT EXTRACTION PHACO AND INTRAOCULAR LENS PLACEMENT (IOC) LEFT DIABETIC TORIC LENS   SURGEON:  Birder Robson, MD.   ANESTHESIA: 1.      Managed anesthesia care. 2.     0.76ml os Shugarcaine was instilled following the paracentesis [email or web address]@   COMPLICATIONS:  None.   TECHNIQUE:   Stop and chop    DESCRIPTION OF PROCEDURE:  The patient was examined and consented in the preoperative holding area where the aforementioned topical anesthesia was applied to the left eye.  The patient was brought back to the Operating Room where he was sat upright on the gurney and given a target to fixate upon while the eye was marked at the 3:00 and 9:00 position.  The patient was then reclined on the operating table.  The eye was prepped and draped in the usual sterile ophthalmic fashion and a lid speculum was placed. A paracentesis was created with the side port blade and the anterior chamber was filled with viscoelastic. A near clear corneal incision was performed with the steel keratome. A continuous curvilinear capsulorrhexis was performed with a cystotome followed by the capsulorrhexis forceps. Hydrodissection and hydrodelineation were carried out with BSS on a blunt cannula. The lens was removed in a stop and chop technique and the remaining cortical material was removed with the irrigation-aspiration handpiece. The eye was inflated with viscoelastic and the [id number] lens was placed in the eye and rotated to within a few degrees of the predetermined orientation.  The remaining viscoelastic was removed from the eye.  The Sinskey hook was used to rotate the toric lens into its final resting place at 083 degrees.  0.1 ml of Vigamox was placed in the anterior chamber. The eye was inflated to a physiologic pressure and found to be  watertight.  The eye was dressed with Vigamox. The patient was given protective glasses to wear throughout the day and a shield with which to sleep tonight. The patient was also given drops with which to begin a drop regimen today and will follow-up with me in one day. Implant Name Type Inv. Item Serial No. Manufacturer Lot No. LRB No. Used Action  LENS IOL TORIC 15.5 - [id number]  LENS IOL TORIC 15.5 [id number] [employer]CON  Left 1 Implanted   Procedure(s) with comments: CATARACT EXTRACTION PHACO AND INTRAOCULAR LENS PLACEMENT (IOC) LEFT DIABETIC TORIC LENS (Left) - [place] 00:28.9 CDE 4.47 Fluid Pack Lot # [id number]  Electronically signed: Birder Robson 10/23/202011:08 AM

## 2018-12-21 NOTE — Transfer of Care (Signed)
Immediate Anesthesia Transfer of Care Note  Patient: Alexa Abbott  Procedure(s) Performed: CATARACT EXTRACTION PHACO AND INTRAOCULAR LENS PLACEMENT (IOC) LEFT DIABETIC TORIC LENS (Left Eye)  Patient Location: PACU  Anesthesia Type:MAC  Level of Consciousness: awake, alert , oriented and patient cooperative  Airway & Oxygen Therapy: Patient Spontanous Breathing  Post-op Assessment: Report given to RN and Post -op Vital signs reviewed and stable  Post vital signs: Reviewed and stable  Last Vitals:  Vitals Value Taken Time  BP    Temp    Pulse    Resp    SpO2      Last Pain:  Vitals:   12/21/18 0838  TempSrc: Temporal  PainSc: 0-No pain         Complications: No apparent anesthesia complications

## 2018-12-21 NOTE — Discharge Instructions (Addendum)
Eye Surgery Discharge Instructions  Expect mild scratchy sensation or mild soreness. DO NOT RUB YOUR EYE!  The day of surgery:  Minimal physical activity, but bed rest is not required  No reading, computer work, or close hand work  No bending, lifting, or straining.  May watch TV  For 24 hours:  No driving, legal decisions, or alcoholic beverages  Safety precautions  Eat anything you prefer: It is better to start with liquids, then soup then solid foods.  Solar shield eyeglasses should be worn for comfort in the sunlight/patch while sleeping  Resume all regular medications including aspirin or Coumadin if these were discontinued prior to surgery. You may shower, bathe, shave, or wash your hair. Tylenol may be taken for mild discomfort. Follow eye drop instruction sheet as reviewed.  Call your doctor if you experience significant pain, nausea, or vomiting, fever > 101 or other signs of infection. [phone] or [phone] Specific instructions:  Follow-up Information    Birder Robson, MD Follow up.   Specialty: Ophthalmology Why: 12-21-18 @ 3:55 pm Contact information: [place] [address] [phone]

## 2018-12-21 NOTE — H&P (Signed)
All labs reviewed. Abnormal studies sent to patients PCP when indicated.  Previous H&P reviewed, patient examined, there are NO CHANGES.  Sammi Stolarz Porfilio10/23/202010:37 AM

## 2019-01-04 ENCOUNTER — Other Ambulatory Visit: Payer: Self-pay

## 2019-01-04 ENCOUNTER — Other Ambulatory Visit
Admission: RE | Admit: 2019-01-04 | Discharge: 2019-01-04 | Disposition: A | Discharge status: Home / Self Care | Payer: Medicare Other | Source: Ambulatory Visit | Attending: Family Medicine | Admitting: Family Medicine

## 2019-01-04 DIAGNOSIS — Z01812 Encounter for preprocedural laboratory examination: Secondary | ICD-10-CM | POA: Insufficient documentation

## 2019-01-04 DIAGNOSIS — Z20828 Contact with and (suspected) exposure to other viral communicable diseases: Secondary | ICD-10-CM | POA: Insufficient documentation

## 2019-01-05 LAB — SARS CORONAVIRUS 2 (TAT 6-24 HRS): SARS Coronavirus 2: NEGATIVE

## 2019-01-09 ENCOUNTER — Ambulatory Visit: Payer: Medicare Other | Attending: Neurology

## 2019-01-09 DIAGNOSIS — G4733 Obstructive sleep apnea (adult) (pediatric): Secondary | ICD-10-CM | POA: Diagnosis present

## 2019-01-09 DIAGNOSIS — G4761 Periodic limb movement disorder: Secondary | ICD-10-CM | POA: Insufficient documentation

## 2019-01-10 ENCOUNTER — Other Ambulatory Visit: Payer: Self-pay

## 2019-03-07 ENCOUNTER — Other Ambulatory Visit: Payer: Self-pay

## 2019-03-07 ENCOUNTER — Encounter: Payer: Self-pay | Admitting: Ophthalmology

## 2019-03-15 ENCOUNTER — Other Ambulatory Visit
Admission: RE | Admit: 2019-03-15 | Discharge: 2019-03-15 | Disposition: A | Discharge status: Home / Self Care | Payer: Medicare Other | Source: Ambulatory Visit | Attending: Ophthalmology | Admitting: Ophthalmology

## 2019-03-15 ENCOUNTER — Other Ambulatory Visit: Payer: Self-pay

## 2019-03-15 DIAGNOSIS — Z20822 Contact with and (suspected) exposure to covid-19: Secondary | ICD-10-CM | POA: Diagnosis not present

## 2019-03-15 DIAGNOSIS — Z01812 Encounter for preprocedural laboratory examination: Secondary | ICD-10-CM | POA: Diagnosis present

## 2019-03-15 NOTE — Anesthesia Preprocedure Evaluation (Addendum)
Anesthesia Evaluation  Patient identified by MRN, date of birth, ID band Patient awake    Reviewed: Allergy & Precautions, NPO status , Patient's Chart, lab work & pertinent test results  History of Anesthesia Complications (+) PONV and history of anesthetic complications  Airway Mallampati: II  TM Distance: >3 FB Neck ROM: Full    Dental   Pulmonary asthma , sleep apnea , former smoker,    breath sounds clear to auscultation       Cardiovascular (-) angina(-) DOE  Rhythm:Regular Rate:Normal   HLD   Neuro/Psych  Neuromuscular disease (Lumbar radiculopathy & spinal stenosis)    GI/Hepatic GERD  Controlled and Medicated, IBS   Endo/Other  diabetes, Type 2  Renal/GU CRFRenal disease (Stones)     Musculoskeletal  (+) Arthritis ,   Abdominal (+) + obese,   Peds  Hematology  (+) anemia ,   Anesthesia Other Findings Gout  Reproductive/Obstetrics                            Anesthesia Physical Anesthesia Plan  ASA: II  Anesthesia Plan: MAC   Post-op Pain Management:    Induction: Intravenous  PONV Risk Score and Plan: 3 and TIVA, Midazolam, Treatment may vary due to age or medical condition and Ondansetron  Airway Management Planned: Nasal Cannula  Additional Equipment:   Intra-op Plan:   Post-operative Plan:   Informed Consent: I have reviewed the patients History and Physical, chart, labs and discussed the procedure including the risks, benefits and alternatives for the proposed anesthesia with the patient or authorized representative who has indicated his/her understanding and acceptance.       Plan Discussed with: CRNA and Anesthesiologist  Anesthesia Plan Comments:        Anesthesia Quick Evaluation

## 2019-03-15 NOTE — Discharge Instructions (Signed)

## 2019-03-16 LAB — SARS CORONAVIRUS 2 (TAT 6-24 HRS): SARS Coronavirus 2: NEGATIVE

## 2019-03-19 ENCOUNTER — Ambulatory Visit
Admission: RE | Admit: 2019-03-19 | Discharge: 2019-03-19 | Disposition: A | Discharge status: Home / Self Care | Payer: Medicare Other | Attending: Ophthalmology | Admitting: Ophthalmology

## 2019-03-19 ENCOUNTER — Encounter: Payer: Self-pay | Admitting: Ophthalmology

## 2019-03-19 ENCOUNTER — Ambulatory Visit: Payer: Medicare Other | Admitting: Anesthesiology

## 2019-03-19 ENCOUNTER — Encounter
Admission: RE | Disposition: A | Discharge status: Home / Self Care | Payer: Self-pay | Source: Home / Self Care | Attending: Ophthalmology

## 2019-03-19 ENCOUNTER — Other Ambulatory Visit: Payer: Self-pay

## 2019-03-19 DIAGNOSIS — M48061 Spinal stenosis, lumbar region without neurogenic claudication: Secondary | ICD-10-CM | POA: Diagnosis not present

## 2019-03-19 DIAGNOSIS — K5730 Diverticulosis of large intestine without perforation or abscess without bleeding: Secondary | ICD-10-CM | POA: Diagnosis not present

## 2019-03-19 DIAGNOSIS — Z7984 Long term (current) use of oral hypoglycemic drugs: Secondary | ICD-10-CM | POA: Insufficient documentation

## 2019-03-19 DIAGNOSIS — H2511 Age-related nuclear cataract, right eye: Secondary | ICD-10-CM | POA: Diagnosis present

## 2019-03-19 DIAGNOSIS — K219 Gastro-esophageal reflux disease without esophagitis: Secondary | ICD-10-CM | POA: Diagnosis not present

## 2019-03-19 DIAGNOSIS — G4730 Sleep apnea, unspecified: Secondary | ICD-10-CM | POA: Insufficient documentation

## 2019-03-19 DIAGNOSIS — M109 Gout, unspecified: Secondary | ICD-10-CM | POA: Insufficient documentation

## 2019-03-19 DIAGNOSIS — E1136 Type 2 diabetes mellitus with diabetic cataract: Secondary | ICD-10-CM | POA: Insufficient documentation

## 2019-03-19 DIAGNOSIS — K589 Irritable bowel syndrome without diarrhea: Secondary | ICD-10-CM | POA: Diagnosis not present

## 2019-03-19 DIAGNOSIS — Z87891 Personal history of nicotine dependence: Secondary | ICD-10-CM | POA: Diagnosis not present

## 2019-03-19 DIAGNOSIS — Z6836 Body mass index (BMI) 36.0-36.9, adult: Secondary | ICD-10-CM | POA: Insufficient documentation

## 2019-03-19 DIAGNOSIS — J45909 Unspecified asthma, uncomplicated: Secondary | ICD-10-CM | POA: Diagnosis not present

## 2019-03-19 DIAGNOSIS — E785 Hyperlipidemia, unspecified: Secondary | ICD-10-CM | POA: Insufficient documentation

## 2019-03-19 DIAGNOSIS — M5416 Radiculopathy, lumbar region: Secondary | ICD-10-CM | POA: Diagnosis not present

## 2019-03-19 DIAGNOSIS — E669 Obesity, unspecified: Secondary | ICD-10-CM | POA: Insufficient documentation

## 2019-03-19 HISTORY — PX: CATARACT EXTRACTION W/PHACO: SHX586

## 2019-03-19 LAB — GLUCOSE, CAPILLARY
Glucose-Capillary: 104 mg/dL — ABNORMAL HIGH (ref 70–99)
Glucose-Capillary: 117 mg/dL — ABNORMAL HIGH (ref 70–99)

## 2019-03-19 SURGERY — PHACOEMULSIFICATION, CATARACT, WITH IOL INSERTION
Anesthesia: Monitor Anesthesia Care | Site: Eye | Laterality: Right

## 2019-03-19 MED ORDER — ARMC OPHTHALMIC DILATING DROPS
1.0000 "application " | OPHTHALMIC | Status: DC | PRN
  Administered 2019-03-19 (×3): 1 via OPHTHALMIC

## 2019-03-19 MED ORDER — TETRACAINE HCL 0.5 % OP SOLN
1.0000 [drp] | OPHTHALMIC | Status: DC | PRN
  Administered 2019-03-19 (×3): 1 [drp] via OPHTHALMIC

## 2019-03-19 MED ORDER — LIDOCAINE HCL (PF) 2 % IJ SOLN
INTRAOCULAR | Status: DC | PRN
  Administered 2019-03-19: 2 mL

## 2019-03-19 MED ORDER — BRIMONIDINE TARTRATE-TIMOLOL 0.2-0.5 % OP SOLN
OPHTHALMIC | Status: DC | PRN
  Administered 2019-03-19: 1 [drp] via OPHTHALMIC

## 2019-03-19 MED ORDER — ONDANSETRON HCL 4 MG/2ML IJ SOLN: 4.0000 mg | Freq: Once | INTRAMUSCULAR | Status: DC | PRN

## 2019-03-19 MED ORDER — ACETAMINOPHEN 10 MG/ML IV SOLN: 1000.0000 mg | Freq: Once | INTRAVENOUS | Status: DC | PRN

## 2019-03-19 MED ORDER — MOXIFLOXACIN HCL 0.5 % OP SOLN
OPHTHALMIC | Status: DC | PRN
  Administered 2019-03-19: 0.2 mL via OPHTHALMIC

## 2019-03-19 MED ORDER — ONDANSETRON HCL 4 MG/2ML IJ SOLN
INTRAMUSCULAR | Status: DC | PRN
  Administered 2019-03-19: 4 mg via INTRAVENOUS

## 2019-03-19 MED ORDER — LACTATED RINGERS IV SOLN: 100.0000 mL/h | INTRAVENOUS | Status: DC

## 2019-03-19 MED ORDER — EPINEPHRINE PF 1 MG/ML IJ SOLN
INTRAOCULAR | Status: DC | PRN
  Administered 2019-03-19: 51 mL via OPHTHALMIC

## 2019-03-19 MED ORDER — NA CHONDROIT SULF-NA HYALURON 40-17 MG/ML IO SOLN
INTRAOCULAR | Status: DC | PRN
  Administered 2019-03-19: 1 mL via INTRAOCULAR

## 2019-03-19 MED ORDER — MIDAZOLAM HCL 2 MG/2ML IJ SOLN
INTRAMUSCULAR | Status: DC | PRN
  Administered 2019-03-19: 2 mg via INTRAVENOUS

## 2019-03-19 MED ORDER — FENTANYL CITRATE (PF) 100 MCG/2ML IJ SOLN
INTRAMUSCULAR | Status: DC | PRN
  Administered 2019-03-19 (×2): 50 ug via INTRAVENOUS

## 2019-03-19 SURGICAL SUPPLY — 22 items
CANNULA ANT/CHMB 27G (MISCELLANEOUS) ×2 IMPLANT
CANNULA ANT/CHMB 27GA (MISCELLANEOUS) ×4 IMPLANT
GLOVE SURG LX 8.0 MICRO (GLOVE) ×1
GLOVE SURG LX STRL 8.0 MICRO (GLOVE) ×1 IMPLANT
GLOVE SURG TRIUMPH 8.0 PF LTX (GLOVE) ×2 IMPLANT
GOWN STRL REUS W/ TWL LRG LVL3 (GOWN DISPOSABLE) ×2 IMPLANT
GOWN STRL REUS W/TWL LRG LVL3 (GOWN DISPOSABLE) ×2
LENS IOL ACRSF IQ TRC 7 20.0 IMPLANT
LENS IOL ACRYSOF IQ TORIC 20.0 ×1 IMPLANT
LENS IOL IQ TORIC 7 20.0 ×1 IMPLANT
MARKER SKIN DUAL TIP RULER LAB (MISCELLANEOUS) ×2 IMPLANT
NDL FILTER BLUNT 18X1 1/2 (NEEDLE) ×1 IMPLANT
NDL RETROBULBAR .5 NSTRL (NEEDLE) ×2 IMPLANT
NEEDLE FILTER BLUNT 18X 1/2SAF (NEEDLE) ×1
NEEDLE FILTER BLUNT 18X1 1/2 (NEEDLE) ×1 IMPLANT
PACK EYE AFTER SURG (MISCELLANEOUS) ×2 IMPLANT
PACK OPTHALMIC (MISCELLANEOUS) ×2 IMPLANT
PACK PORFILIO (MISCELLANEOUS) ×2 IMPLANT
SYR 3ML LL SCALE MARK (SYRINGE) ×2 IMPLANT
SYR TB 1ML LUER SLIP (SYRINGE) ×2 IMPLANT
WATER STERILE IRR 250ML POUR (IV SOLUTION) ×2 IMPLANT
WIPE NON LINTING 3.25X3.25 (MISCELLANEOUS) ×2 IMPLANT

## 2019-03-19 NOTE — H&P (Signed)
All labs reviewed. Abnormal studies sent to patients PCP when indicated.  Previous H&P reviewed, patient examined, there are NO CHANGES.  Alexa Abbott Porfilio1/19/20217:24 AM

## 2019-03-19 NOTE — Anesthesia Postprocedure Evaluation (Signed)
Anesthesia Post Note  Patient: Alexa Abbott  Procedure(s) Performed: CATARACT EXTRACTION PHACO AND INTRAOCULAR LENS PLACEMENT ([facility]) RIGHT TORIC LENS DIABETIC 5.46 00:42.1 (Right Eye)     Patient location during evaluation: PACU Anesthesia Type: MAC Level of consciousness: awake and alert Pain management: pain level controlled Vital Signs Assessment: post-procedure vital signs reviewed and stable Respiratory status: spontaneous breathing, nonlabored ventilation, respiratory function stable and patient connected to nasal cannula oxygen Cardiovascular status: stable and blood pressure returned to baseline Postop Assessment: no apparent nausea or vomiting Anesthetic complications: no    Nyaisha Simao A  Hermilo Dutter

## 2019-03-19 NOTE — Anesthesia Procedure Notes (Signed)
Procedure Name: MAC Performed by: Izetta Dakin, CRNA Pre-anesthesia Checklist: Patient identified, Emergency Drugs available, Suction available, Patient being monitored and Timeout performed Patient Re-evaluated:Patient Re-evaluated prior to induction Oxygen Delivery Method: Nasal cannula

## 2019-03-19 NOTE — Op Note (Signed)
PREOPERATIVE DIAGNOSIS:  Nuclear sclerotic cataract of the right eye.   POSTOPERATIVE DIAGNOSIS:  Nuclear sclerotic cataract of the right eye.   OPERATIVE PROCEDURE: Procedure(s): CATARACT EXTRACTION PHACO AND INTRAOCULAR LENS PLACEMENT ([facility]) RIGHT TORIC LENS DIABETIC 5.46 00:42.1   SURGEON:  Galen Manila, MD.   ANESTHESIA: 1.      Managed anesthesia care. 2.     0.54ml of Shugarcaine was instilled following the paracentesis  Anesthesiologist: Heniser, Burman Foster, MD CRNA: Omer Jack, CRNA  COMPLICATIONS:  None.   TECHNIQUE:   Stop and chop    DESCRIPTION OF PROCEDURE:  The patient was examined and consented in the preoperative holding area where the aforementioned topical anesthesia was applied to the right eye.  The patient was brought back to the Operating Room where he was sat upright on the gurney and given a target to fixate upon while the eye was marked at the 3:00 and 9:00 position.  The patient was then reclined on the operating table.  The eye was prepped and draped in the usual sterile ophthalmic fashion and a lid speculum was placed. A paracentesis was created with the side port blade and the anterior chamber was filled with viscoelastic. A near clear corneal incision was performed with the steel keratome. A continuous curvilinear capsulorrhexis was performed with a cystotome followed by the capsulorrhexis forceps. Hydrodissection and hydrodelineation were carried out with BSS on a blunt cannula. The lens was removed in a stop and chop technique and the remaining cortical material was removed with the irrigation-aspiration handpiece. The eye was inflated with viscoelastic and the AT4  lens  was placed in the eye and rotated to within a few degrees of the predetermined orientation.  The remaining viscoelastic was removed from the eye.  The Sinskey hook was used to rotate the toric lens into its final resting place at 084 degrees. . The eye was inflated to a physiologic pressure  and found to be watertight. 0.31ml of Vigamox was placed in the anterior chamber.  The eye was dressed with Vigamox. The patient was given protective glasses to wear throughout the day and a shield with which to sleep tonight. The patient was also given drops with which to begin a drop regimen today and will follow-up with me in one day. Implant Name Type Inv. Item Serial No. Manufacturer Lot No. LRB No. Used Action  LENS ACRYSOF IQ Kimball - [id number]  LENS ACRYSOF IQ TORIC [id number] ALCON  Right 1 Implanted   Procedure(s) with comments: CATARACT EXTRACTION PHACO AND INTRAOCULAR LENS PLACEMENT ([facility]) RIGHT TORIC LENS DIABETIC 5.46 00:42.1 (Right) - Diabetic - oral meds  Electronically signed: Galen Manila 03/19/2019 7:57 AM

## 2019-03-19 NOTE — Transfer of Care (Signed)
Immediate Anesthesia Transfer of Care Note  Patient: Alexa Abbott  Procedure(s) Performed: CATARACT EXTRACTION PHACO AND INTRAOCULAR LENS PLACEMENT ([facility]) RIGHT TORIC LENS DIABETIC 5.46 00:42.1 (Right Eye)  Patient Location: PACU  Anesthesia Type: MAC  Level of Consciousness: awake, alert  and patient cooperative  Airway and Oxygen Therapy: Patient Spontanous Breathing and Patient connected to supplemental oxygen  Post-op Assessment: Post-op Vital signs reviewed, Patient's Cardiovascular Status Stable, Respiratory Function Stable, Patent Airway and No signs of Nausea or vomiting  Post-op Vital Signs: Reviewed and stable  Complications: No apparent anesthesia complications

## 2019-03-20 ENCOUNTER — Encounter: Payer: Self-pay | Admitting: *Deleted

## 2019-11-21 ENCOUNTER — Other Ambulatory Visit: Payer: Self-pay | Admitting: Family Medicine

## 2019-11-21 DIAGNOSIS — Z1231 Encounter for screening mammogram for malignant neoplasm of breast: Secondary | ICD-10-CM

## 2019-12-13 ENCOUNTER — Other Ambulatory Visit: Payer: Self-pay

## 2019-12-13 ENCOUNTER — Ambulatory Visit
Admission: RE | Admit: 2019-12-13 | Discharge: 2019-12-13 | Disposition: A | Discharge status: Home / Self Care | Payer: Medicare Other | Source: Ambulatory Visit | Attending: Family Medicine | Admitting: Family Medicine

## 2019-12-13 DIAGNOSIS — Z1231 Encounter for screening mammogram for malignant neoplasm of breast: Secondary | ICD-10-CM | POA: Diagnosis not present

## 2020-07-02 ENCOUNTER — Other Ambulatory Visit: Payer: Self-pay | Admitting: Pain Medicine

## 2020-07-02 DIAGNOSIS — M5134 Other intervertebral disc degeneration, thoracic region: Secondary | ICD-10-CM

## 2020-07-13 ENCOUNTER — Other Ambulatory Visit: Payer: Self-pay

## 2020-07-13 ENCOUNTER — Ambulatory Visit
Admission: RE | Admit: 2020-07-13 | Discharge: 2020-07-13 | Disposition: A | Discharge status: Home / Self Care | Payer: Medicare Other | Source: Ambulatory Visit | Attending: Pain Medicine | Admitting: Pain Medicine

## 2020-07-13 DIAGNOSIS — M5134 Other intervertebral disc degeneration, thoracic region: Secondary | ICD-10-CM | POA: Insufficient documentation

## 2020-11-30 ENCOUNTER — Other Ambulatory Visit: Payer: Self-pay | Admitting: Family Medicine

## 2020-11-30 DIAGNOSIS — Z1231 Encounter for screening mammogram for malignant neoplasm of breast: Secondary | ICD-10-CM

## 2020-12-23 ENCOUNTER — Other Ambulatory Visit: Payer: Self-pay

## 2020-12-23 ENCOUNTER — Ambulatory Visit
Admission: RE | Admit: 2020-12-23 | Discharge: 2020-12-23 | Disposition: A | Discharge status: Home / Self Care | Payer: Medicare Other | Source: Ambulatory Visit | Attending: Family Medicine | Admitting: Family Medicine

## 2020-12-23 DIAGNOSIS — Z1231 Encounter for screening mammogram for malignant neoplasm of breast: Secondary | ICD-10-CM | POA: Insufficient documentation

## 2021-01-16 IMAGING — MG MM DIGITAL SCREENING BILAT W/ TOMO W/ CAD
6 of 12 series · 6 of 36 positions shown · non-contrast
Comparison: Previous exam(s).

ACR Breast Density Category a: The breast tissue is almost entirely
fatty.

CLINICAL DATA: Screening.

EXAM:
DIGITAL SCREENING BILATERAL MAMMOGRAM WITH TOMO AND CAD

[R MLO synth-2D]
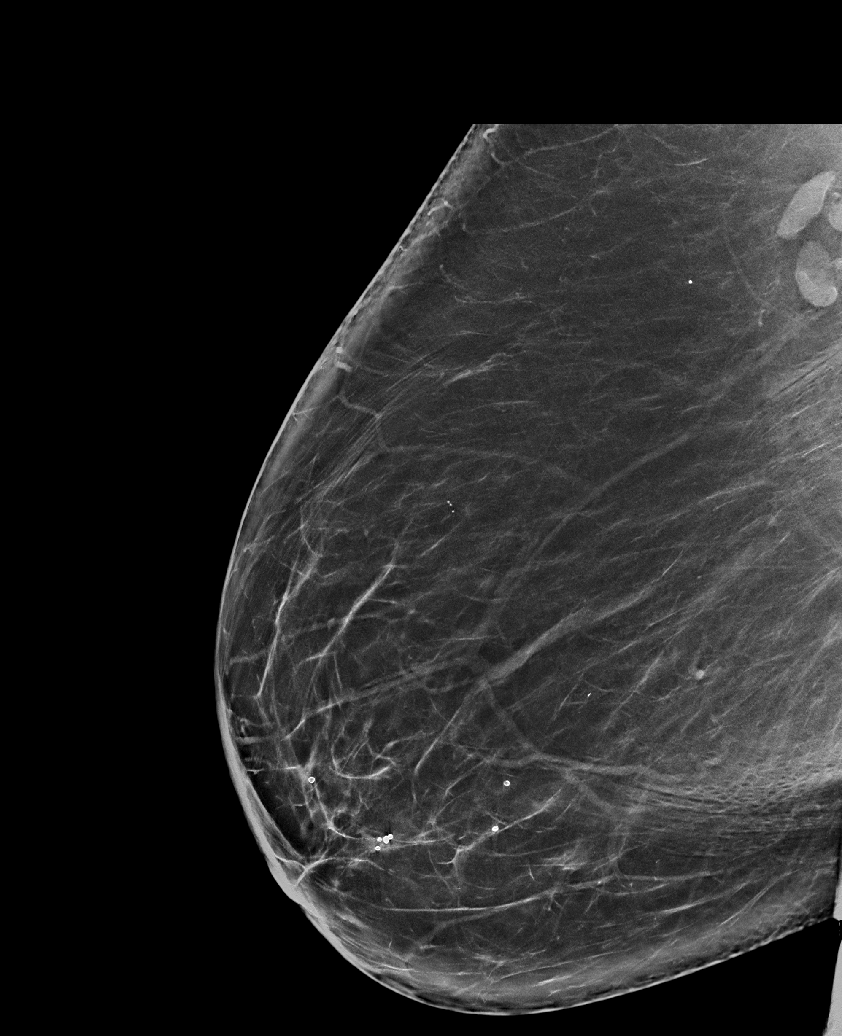

[L MLO synth-2D (1 of 2)]
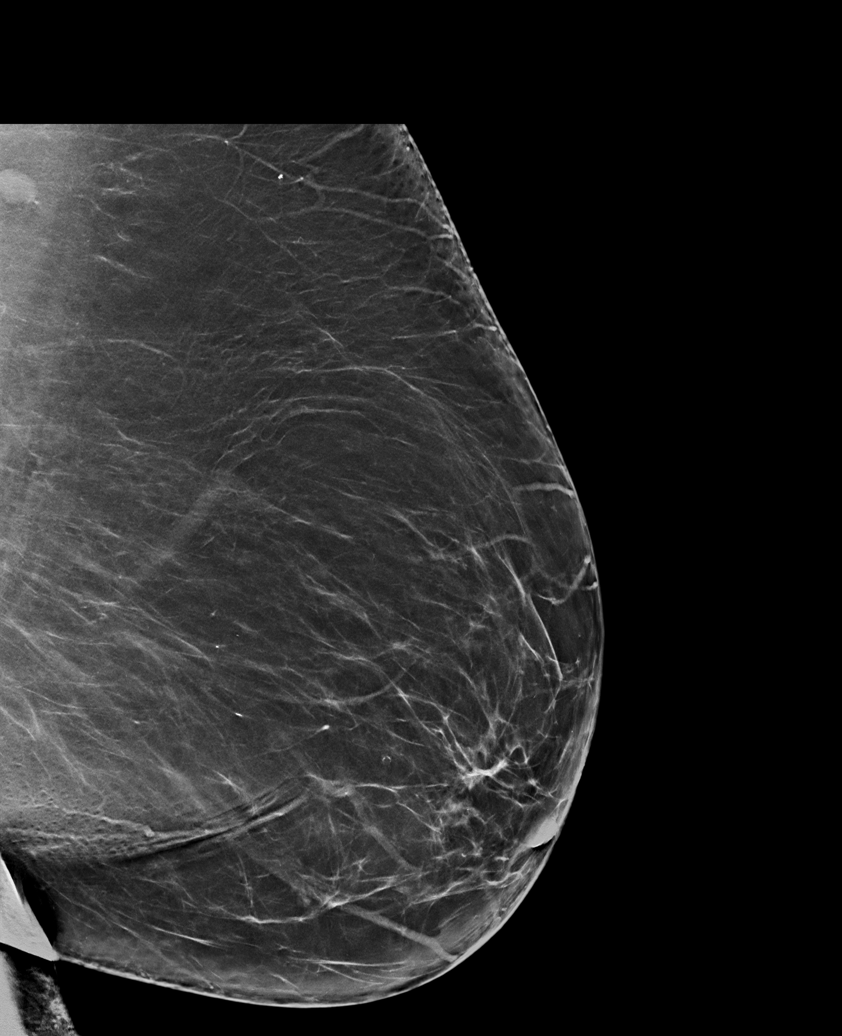

[R CV synth-2D]
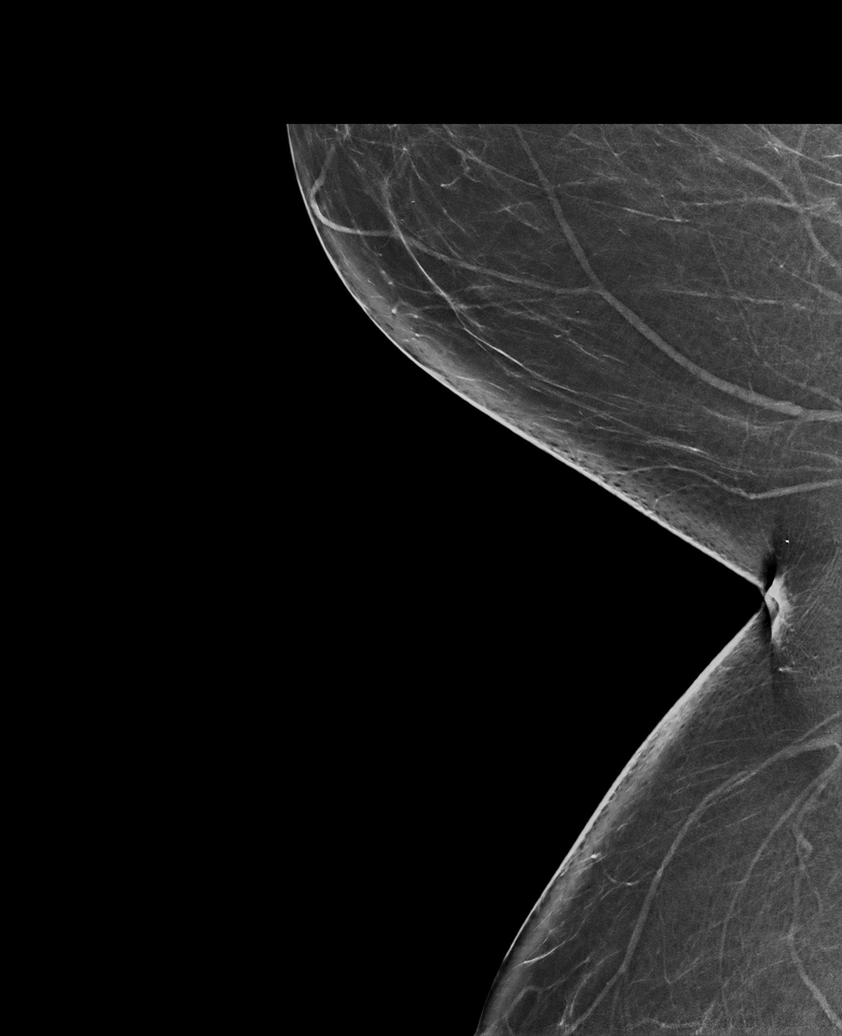

[L CC synth-2D]
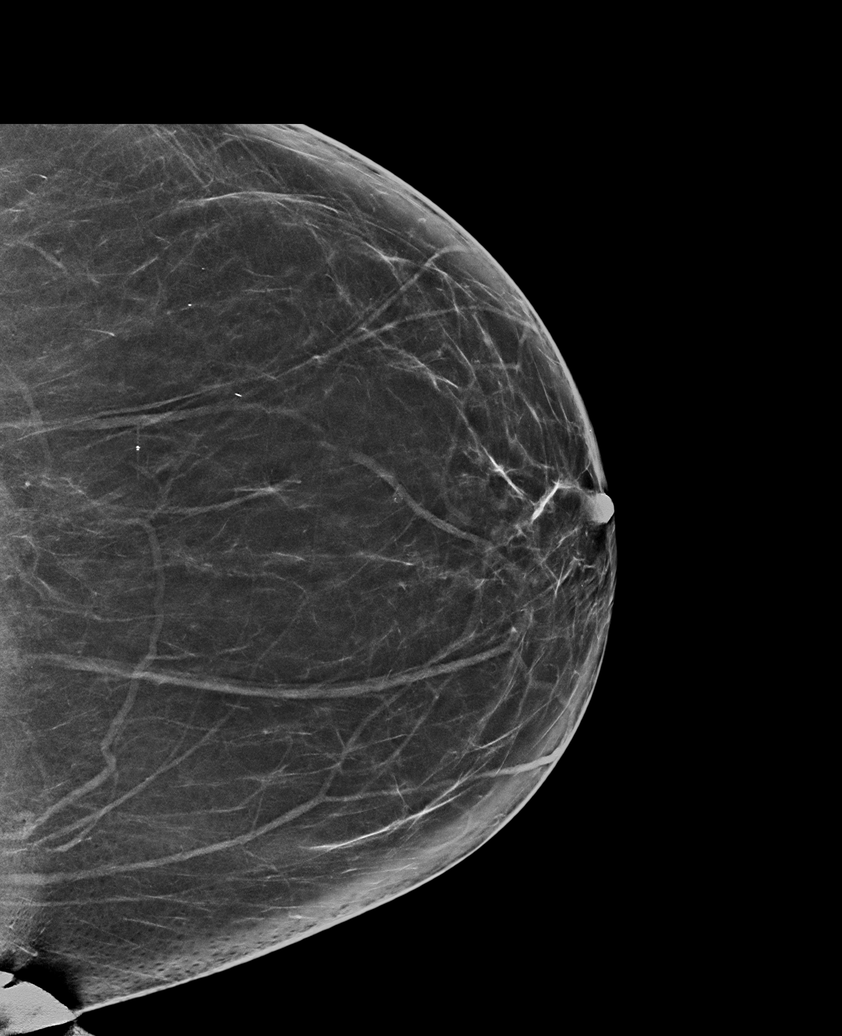

[R CC synth-2D]
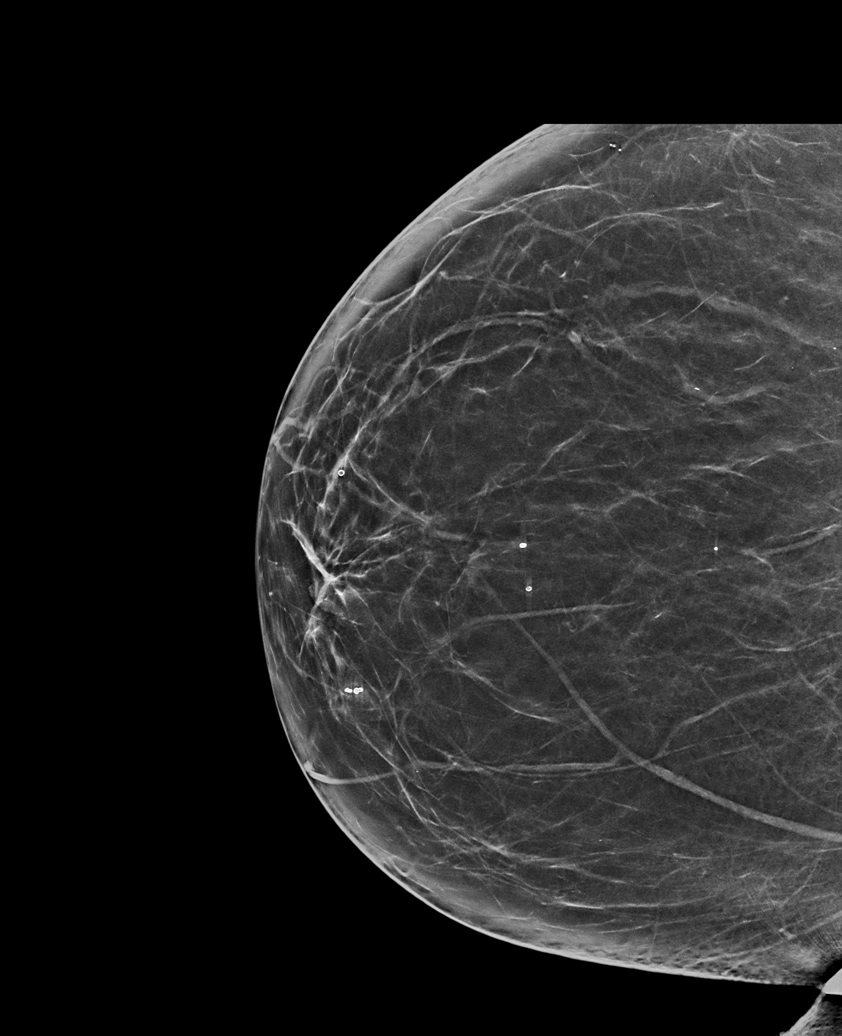

[L MLO synth-2D (2 of 2)]
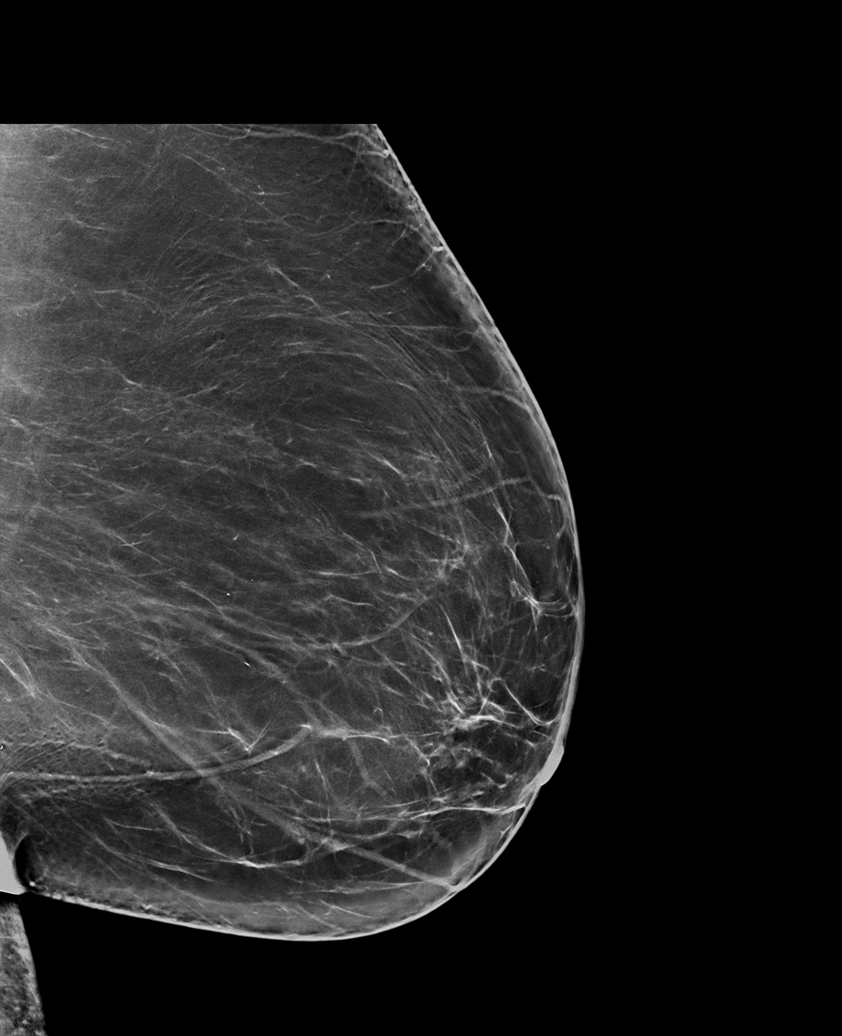

[6 of 36 positions shown; findings below may reference images not displayed]

FINDINGS: There are no findings suspicious for malignancy. Images were
processed with CAD.
IMPRESSION: No mammographic evidence of malignancy. A result letter of this
screening mammogram will be mailed directly to the patient.

RECOMMENDATION:
Screening mammogram in one year. (Code:[id number])

BI-RADS CATEGORY  1: Negative.

## 2021-12-22 ENCOUNTER — Other Ambulatory Visit: Payer: Self-pay | Admitting: Family Medicine

## 2021-12-22 DIAGNOSIS — Z1231 Encounter for screening mammogram for malignant neoplasm of breast: Secondary | ICD-10-CM

## 2022-01-02 ENCOUNTER — Encounter: Payer: Self-pay | Admitting: Intensive Care

## 2022-01-02 ENCOUNTER — Emergency Department
Admission: EM | Admit: 2022-01-02 | Discharge: 2022-01-02 | Disposition: A | Discharge status: Home / Self Care | Payer: Medicare Other | Attending: Emergency Medicine | Admitting: Emergency Medicine

## 2022-01-02 ENCOUNTER — Emergency Department: Payer: Medicare Other

## 2022-01-02 ENCOUNTER — Other Ambulatory Visit: Payer: Self-pay

## 2022-01-02 DIAGNOSIS — M5442 Lumbago with sciatica, left side: Secondary | ICD-10-CM | POA: Insufficient documentation

## 2022-01-02 DIAGNOSIS — Z87891 Personal history of nicotine dependence: Secondary | ICD-10-CM | POA: Diagnosis not present

## 2022-01-02 DIAGNOSIS — Z7984 Long term (current) use of oral hypoglycemic drugs: Secondary | ICD-10-CM | POA: Insufficient documentation

## 2022-01-02 DIAGNOSIS — E119 Type 2 diabetes mellitus without complications: Secondary | ICD-10-CM | POA: Insufficient documentation

## 2022-01-02 DIAGNOSIS — M545 Low back pain, unspecified: Secondary | ICD-10-CM | POA: Diagnosis present

## 2022-01-02 DIAGNOSIS — M5440 Lumbago with sciatica, unspecified side: Secondary | ICD-10-CM

## 2022-01-02 DIAGNOSIS — J45909 Unspecified asthma, uncomplicated: Secondary | ICD-10-CM | POA: Insufficient documentation

## 2022-01-02 MED ORDER — OXYCODONE-ACETAMINOPHEN 5-325 MG PO TABS: 1.0000 | ORAL_TABLET | Freq: Four times a day (QID) | ORAL | 0 refills | Status: AC | PRN

## 2022-01-02 MED ORDER — DIPHENHYDRAMINE HCL 25 MG PO CAPS
25.0000 mg | ORAL_CAPSULE | Freq: Once | ORAL | Status: DC
  Filled 2022-01-02: qty 1

## 2022-01-02 MED ORDER — DIPHENHYDRAMINE HCL 50 MG/ML IJ SOLN
12.5000 mg | Freq: Once | INTRAMUSCULAR | Status: AC
  Administered 2022-01-02: 12.5 mg via INTRAVENOUS
  Filled 2022-01-02: qty 1

## 2022-01-02 MED ORDER — MORPHINE SULFATE (PF) 4 MG/ML IV SOLN
4.0000 mg | Freq: Once | INTRAVENOUS | Status: AC
  Administered 2022-01-02: 4 mg via INTRAVENOUS
  Filled 2022-01-02: qty 1

## 2022-01-02 MED ORDER — ONDANSETRON HCL 4 MG/2ML IJ SOLN
4.0000 mg | Freq: Once | INTRAMUSCULAR | Status: AC
  Administered 2022-01-02: 4 mg via INTRAVENOUS
  Filled 2022-01-02: qty 2

## 2022-01-02 MED ORDER — PREDNISONE 10 MG (21) PO TBPK: ORAL_TABLET | ORAL | 0 refills | Status: AC

## 2022-01-02 NOTE — ED Triage Notes (Signed)
Patient c/o left lower back pain that radiates down leg that started Friday and has gradually gotten worse. Denies injury.

## 2022-01-02 NOTE — Discharge Instructions (Signed)
You have been seen today in the emergency room for lower back pain with sciatic component.  You will be treated with a dose of steroids as well as short course of pain medication.  You should follow-up with your primary care provider the first of next week. The x-ray of your lumbar spine that was done in the emergency room today shows no acute findings.

## 2022-01-02 NOTE — ED Provider Notes (Signed)
[facility] Emergency Department Provider Note   ____________________________________________   Event Date/Time   First MD Initiated Contact with Patient 01/02/22 1133     (approximate)  I have reviewed the triage vital signs and the nursing notes.   HISTORY  Chief Complaint Sciatica (left)    HPI Alexa Abbott is a 71 y.o. female to the emergency room for complaint of lower back pain and sciatica. Patient has significant history of back surgeries requiring nerve stimulators.  Last neurostimulator was placed 1 year ago.  Prior surgeries have been in regards to bulging/ruptured disc and were all right sided.  However, her complaint today is left sided. Reports that 2 days ago she awoke with severe pain in her left lower lumbar region.  The pain then began to wrap around into her buttocks down into her hip and extending down into her thigh.  She reports that she does have pins and needle sensation into her leg.  She has not had any change in bowel or bladder habits.  She states that for the past 48 hours she has only been able to stand or lay.  She is unable to sit due to the excruciating pain.  She reports the pain is a constant 5 out of 10 but if she attempts to sit it is an immediate 10 out of 10.  The pain is sharp/stabbing/burning in nature. She had leftover oxycodone from previous surgery 1 year ago and has been taking that without any relief of symptoms.  Patient reports that she is having the same pain as she had with previous ruptured disc.   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   Kidney stone    stones in right kidney   Low urine output 06/06/2012   Numbness in right leg 10/09/2014   Pancreatitis 09/2018   required hospitalization   Plantar fasciitis    PONV (postoperative nausea and vomiting)    Renal insufficiency    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   Sacroiliac joint pain 10/22/2015   SBO (small bowel obstruction) (HCC)    Sleep apnea 09/2018   needs a cpap but waiting for 2nd test   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   Type 2 diabetes mellitus [facility])     Patient Active Problem List   Diagnosis Date Noted   Intestinal adhesions with complete obstruction (HCC)    Irritable bowel syndrome with diarrhea 07/12/2017   History of lumbar laminectomy for spinal cord decompression 06/13/2017   Lumbosacral radiculopathy at L4 06/13/2017   Ileus (HCC)    Small bowel obstruction (HCC) 04/13/2016   Abdominal pain, generalized    SBO (small bowel obstruction) (HCC)    Sacroiliac joint pain 10/22/2015   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/01/2015   DDD (degenerative disc disease), lumbar 10/09/2014   Numbness in right leg 10/09/2014   Diabetes (HCC) 07/25/2013   Kidney stone 06/06/2012  Gouty kidney disease 06/06/2012   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   Low urine output 06/06/2012    Past Surgical History:  Procedure Laterality Date   ABDOMINAL HYSTERECTOMY     APPENDECTOMY     CATARACT EXTRACTION W/PHACO Left 12/21/2018   Procedure: CATARACT EXTRACTION PHACO AND INTRAOCULAR LENS PLACEMENT ([place]) LEFT DIABETIC TORIC LENS;  Surgeon: Birder Robson, MD;  Location: [facility] ORS;  Service: Ophthalmology;  Laterality: Left;  [place] 00:28.9 CDE 4.47 Fluid Pack Lot # [id number]   CATARACT EXTRACTION W/PHACO Right 03/19/2019   Procedure: CATARACT EXTRACTION PHACO AND INTRAOCULAR LENS PLACEMENT  (IOC) RIGHT TORIC LENS DIABETIC 5.46 00:42.1;  Surgeon: Birder Robson, MD;  Location: [place];  Service: Ophthalmology;  Laterality: Right;  Diabetic - oral meds   CHOLECYSTECTOMY     COLONOSCOPY     LAPAROTOMY N/A 08/14/2017   Procedure: EXPLORATORY LAPAROTOMY;  Surgeon: Jules Husbands, MD;  Location: [facility] ORS;  Service: General;  Laterality: N/A;   MICRODISCECTOMY LUMBAR  2016, 2018   PILONIDAL CYST EXCISION      Prior to Admission medications   Medication Sig Start Date End Date Taking? Authorizing Provider  oxyCODONE-acetaminophen (PERCOCET) 5-325 MG tablet Take 1 tablet by mouth every 6 (six) hours as needed for up to 5 days for severe pain. 01/02/22 01/07/22 Yes Willaim Rayas, NP  predniSONE (STERAPRED UNI-PAK 21 TAB) 10 MG (21) TBPK tablet Take 6 pills day one and then decrease by 1 pill each day 01/02/22  Yes Willaim Rayas, NP  BAYER CONTOUR TEST test strip TEST ONCE D UTD 01/19/16   [provider]  Biotin 5000 MCG TABS Take 5,000 mcg by mouth daily.    [provider]  diphenhydrAMINE (BENADRYL) 25 mg capsule Take 25 mg by mouth at bedtime.    [provider]  docusate sodium (COLACE) 100 MG capsule Take 100 mg by mouth at bedtime.    [provider]  famotidine (PEPCID) 20 MG tablet Take 20 mg by mouth 2 (two) times daily.    [provider]  loperamide (IMODIUM) 2 MG capsule Take 2 mg by mouth daily.    [provider]  loratadine (CLARITIN) 10 MG tablet Take 10 mg by mouth daily. IN THE MORNING    [provider]  meloxicam (MOBIC) 15 MG tablet Take 15 mg by mouth daily as needed for pain. 10/20/18   [provider]  metFORMIN (GLUCOPHAGE-XR) 500 MG 24 hr tablet Take 500 mg by mouth every evening.  12/11/15   [provider]  metroNIDAZOLE (METROCREAM) 0.75 % cream Apply 1 application topically 2 (two) times daily. 09/13/18   [provider]  potassium citrate (UROCIT-K) 10 MEQ  (1080 MG) SR tablet Take 20 mEq by mouth 2 (two) times daily. BREAKFAST AND BEDTIME    [provider]    Allergies Doxycycline, Adhesive [tape], Codeine, and Penicillins  Family History  Problem Relation Age of Onset   Diabetes Mother    Hypertension Mother    Stroke Mother    Crohn's disease Mother    Throat cancer Father    Hypertension Father    Diabetes Father    Lung cancer Sister    Cancer Sister        lung, liver, bone   Anesthesia problems Daughter    Diabetes Maternal Grandmother    Breast cancer Neg Hx     Social History Social History   Tobacco Use   Smoking status: Former    Types: Cigarettes  Quit date: 83    Years since quitting: 25.8   Smokeless tobacco: Never  Vaping Use   Vaping Use: Never used  Substance Use Topics   Alcohol use: Yes    Comment: occasionally // rarely   Drug use: Yes    Comment: prescribed oxycodone and tramadol as needed    Review of Systems  Constitutional: No fever/chills Eyes: No visual changes. ENT: No sore throat. Cardiovascular: Denies chest pain. Respiratory: Denies shortness of breath. Gastrointestinal: No abdominal pain.  No nausea, no vomiting.  No diarrhea.  No constipation. Genitourinary: Negative for dysuria. Musculoskeletal: Positive for back pain Skin: Negative for rash. Neurological: Negative for headaches, focal weakness or numbness.   ____________________________________________   PHYSICAL EXAM:  VITAL SIGNS: ED Triage Vitals  Enc Vitals Group     BP 01/02/22 1119 (!) 150/69     Pulse Rate 01/02/22 1119 93     Resp 01/02/22 1119 20     Temp 01/02/22 1119 98 F (36.7 C)     Temp Source 01/02/22 1119 Oral     SpO2 01/02/22 1119 93 %     Weight 01/02/22 1120 177 lb 9.6 oz (80.6 kg)     Height 01/02/22 1120 5' 0.25" (1.53 m)     Head Circumference --      Peak Flow --      Pain Score 01/02/22 1123 9     Pain Loc --      Pain Edu? --      Excl. in GC? --     Constitutional:  Alert and oriented. Well appearing and in no acute distress. Eyes: Conjunctivae are normal. PERRL. EOMI. Head: Atraumatic. Nose: No congestion/rhinnorhea. Mouth/Throat: Mucous membranes are moist.  Oropharynx non-erythematous. Neck: No stridor.   Cardiovascular: Normal rate, regular rhythm. Grossly normal heart sounds.  Good peripheral circulation. Respiratory: Normal respiratory effort.  No retractions. Lungs CTAB. Gastrointestinal: Soft and nontender. No distention. No abdominal bruits. No CVA tenderness. Musculoskeletal: Patient is tender to palpation over med and left lumbar region.  There is no redness/warmth/swelling noted to the area.  Patient has positive straight leg raise.  She can only lift leg approximately 2 inches off the bed before excruciating pain is shooting up through her hip/back.  Patient's sensation is intact. Neurologic:  Normal speech and language. No gross focal neurologic deficits are appreciated. No gait instability. Skin:  Skin is warm, dry and intact. No rash noted. Psychiatric: Mood and affect are normal. Speech and behavior are normal.  ____________________________________________   LABS (all labs ordered are listed, but only abnormal results are displayed)  Labs Reviewed - No data to display ____________________________________________  EKG   ____________________________________________  RADIOLOGY  ED MD interpretation: Lumbar spine x-ray was reviewed by me and read by radiologist.  Official radiology report(s): DG Lumbar Spine Complete  Result Date: 01/02/2022 CLINICAL DATA:  Low back pain with sciatica for 3 days. EXAM: LUMBAR SPINE - COMPLETE 4+ VIEW COMPARISON:  None Available. FINDINGS: There is no evidence of lumbar spine fracture. Alignment is normal. Mild degenerative disc disease seen at all lumbar levels. Mild bilateral facet DJD at L5-S1. No focal lytic or sclerotic bone lesions identified. Neurostimulator device is seen with leads in the  lower thoracic spinal canal. Aortic atherosclerotic calcification incidentally noted. IMPRESSION: No acute findings. Degenerative spondylosis, as described above. Electronically Signed   By: Danae Orleans M.D.   On: 01/02/2022 12:57    ____________________________________________   PROCEDURES  Procedure(s) performed: None  Procedures  Critical Care performed: No  ____________________________________________   INITIAL IMPRESSION / ASSESSMENT AND PLAN / ED COURSE     TONI DEMO is a 71 y.o. female to the emergency room for complaint of lower back pain and sciatica. Patient has significant history of back surgeries requiring nerve stimulators.  Last neurostimulator was placed 1 year ago.  Prior surgeries have been in regards to bulging/ruptured disc and were all right sided.  However, her complaint today is left sided. Reports that 2 days ago she awoke with severe pain in her left lower lumbar region.  The pain then began to wrap around into her buttocks down into her hip and extending down into her thigh.  She reports that she does have pins and needle sensation into her leg.  She has not had any change in bowel or bladder habits.  She states that for the past 48 hours she has only been able to stand or lay.  She is unable to sit due to the excruciating pain.  She reports the pain is a constant 5 out of 10 but if she attempts to sit it is an immediate 10 out of 10.  The pain is sharp/stabbing/burning in nature. She had leftover oxycodone from previous surgery 1 year ago and has been taking that without any relief of symptoms.  Patient reports that she is having the same pain as she had with previous ruptured disc.   Will obtain x-ray of lumbar spine and medicate patient for pain with morphine.  Patient does have listed allergy to codeine.  However she states that she has taken morphine in the past and had no issues.  She does request that she take Benadryl with dosing.  I will also  prescribe Zofran for potential nausea.  Lumbar spine x-ray shows no acute findings. Will treat patient for lumbar back pain with sciatic component with course of steroids and short course of pain medication. Patient should follow-up with her primary care provider first of next week . Patient will be discharged home in stable condition at this time.      ____________________________________________   FINAL CLINICAL IMPRESSION(S) / ED DIAGNOSES  Final diagnoses:  Back pain of lumbar region with sciatica     ED Discharge Orders          Ordered    predniSONE (STERAPRED UNI-PAK 21 TAB) 10 MG (21) TBPK tablet        01/02/22 1400    oxyCODONE-acetaminophen (PERCOCET) 5-325 MG tablet  Every 6 hours PRN        01/02/22 1400             Note:  This document was prepared using Dragon voice recognition software and may include unintentional dictation errors.     Herschell Dimes, NP 01/02/22 1402    Chesley Noon, MD 01/02/22 2003

## 2022-01-28 ENCOUNTER — Ambulatory Visit
Admission: RE | Admit: 2022-01-28 | Discharge: 2022-01-28 | Disposition: A | Discharge status: Home / Self Care | Payer: Medicare Other | Source: Ambulatory Visit | Attending: Family Medicine | Admitting: Family Medicine

## 2022-01-28 DIAGNOSIS — Z1231 Encounter for screening mammogram for malignant neoplasm of breast: Secondary | ICD-10-CM | POA: Insufficient documentation

## 2022-07-21 ENCOUNTER — Encounter: Payer: Self-pay | Admitting: Gastroenterology

## 2022-07-21 NOTE — H&P (Signed)
Pre-Procedure H&P   Patient ID: Alexa Abbott is a 72 y.o. female.  Gastroenterology Provider: Jaynie Collins, DO  Referring Provider: Tawni Pummel, PA PCP: Dorothey Baseman, MD  Date: 07/22/2022  HPI Alexa Abbott is a 72 y.o. female who presents today for Esophagogastroduodenoscopy and Colonoscopy for Diarrhea, personal history of colon polyps, dysphagia .  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  She noted increased vomiting and diarrhea after starting Trulicity.  She has since switched to Ozempic and found improvement.  She notes liquid dysphagia but no difficulties with solids or pills.  No odynophagia.  She has dealt with chronic diarrhea historically.  She experiences urgency if she does not take a regimen of Imodium and Benadryl.  Otherwise does well on this without melena or hematochezia.  Last underwent colonoscopy in July 2019 demonstrating 2 adenomatous polyps and internal hemorrhoids.  Previous testing negative for celiac and IBD.  Currently has 2-3 bowel movements daily  Hemoglobin 14.1 MCV 90 platelets 198,000 creatinine 1.0 Status post spinal stimulator, cholecystectomy and hysterectomy No family history of colon cancer or colon polyps.  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   Kidney stone    stones in right kidney   Low urine output 06/06/2012   Numbness in right leg 10/09/2014   Pancreatitis 09/2018   required  hospitalization   Plantar fasciitis    PONV (postoperative nausea and vomiting)    Renal insufficiency    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   Sacroiliac joint pain 10/22/2015   SBO (small bowel obstruction) (HCC)    Sleep apnea 09/2018   needs a cpap but waiting for 2nd test   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   Type 2 diabetes mellitus (HCC)     Past Surgical History:  Procedure Laterality Date   ABDOMINAL HYSTERECTOMY     APPENDECTOMY     BACK SURGERY     CATARACT EXTRACTION W/PHACO Left 12/21/2018   Procedure: CATARACT EXTRACTION PHACO AND INTRAOCULAR LENS PLACEMENT (IOC) LEFT DIABETIC TORIC LENS;  Surgeon: Galen Manila, MD;  Location: [facility];  Service: Ophthalmology;  Laterality: Left;  [place] 00:28.9 CDE 4.47 Fluid Pack Lot # [id number]   CATARACT EXTRACTION W/PHACO Right 03/19/2019   Procedure: CATARACT EXTRACTION PHACO AND INTRAOCULAR LENS PLACEMENT (IOC) RIGHT TORIC LENS DIABETIC 5.46 00:42.1;  Surgeon: Galen Manila, MD;  Location: [facility];  Service: Ophthalmology;  Laterality: Right;  Diabetic - oral meds   CHOLECYSTECTOMY     COLONOSCOPY     EYE SURGERY     LAPAROTOMY N/A 08/14/2017   Procedure: EXPLORATORY LAPAROTOMY;  Surgeon: Leafy Ro, MD;  Location: [facility];  Service: General;  Laterality: N/A;   MICRODISCECTOMY LUMBAR  2016, 2018   PILONIDAL CYST EXCISION      Family History Mother Crohn's and pancreatitis No h/o GI disease or malignancy  Review of Systems  Constitutional:  Negative  for activity change, appetite change, chills, diaphoresis, fatigue, fever and unexpected weight change.  HENT:  Positive for trouble swallowing. Negative for voice change.   Respiratory:  Negative for shortness of breath and wheezing.   Cardiovascular:  Negative for chest pain, palpitations and leg swelling.  Gastrointestinal:  Positive for diarrhea. Negative for abdominal distention, abdominal pain, anal bleeding, blood in  stool, constipation, nausea, rectal pain and vomiting.  Musculoskeletal:  Negative for arthralgias and myalgias.  Skin:  Negative for color change and pallor.  Neurological:  Negative for dizziness, syncope and weakness.  Psychiatric/Behavioral:  Negative for confusion.   All other systems reviewed and are negative.    Medications No current facility-administered medications on file prior to encounter.   Current Outpatient Medications on File Prior to Encounter  Medication Sig Dispense Refill   BAYER CONTOUR TEST test strip TEST ONCE D UTD  3   diphenhydrAMINE (BENADRYL) 25 mg capsule Take 25 mg by mouth at bedtime.     docusate sodium (COLACE) 100 MG capsule Take 100 mg by mouth at bedtime.     famotidine (PEPCID) 20 MG tablet Take 20 mg by mouth 2 (two) times daily.     loperamide (IMODIUM) 2 MG capsule Take 2 mg by mouth daily.     loratadine (CLARITIN) 10 MG tablet Take 10 mg by mouth daily. IN THE MORNING     meloxicam (MOBIC) 15 MG tablet Take 15 mg by mouth daily as needed for pain.     metFORMIN (GLUCOPHAGE-XR) 500 MG 24 hr tablet Take 500 mg by mouth every evening.      metroNIDAZOLE (METROCREAM) 0.75 % cream Apply 1 application topically 2 (two) times daily.     predniSONE (STERAPRED UNI-PAK 21 TAB) 10 MG (21) TBPK tablet Take 6 pills day one and then decrease by 1 pill each day 21 tablet 0   Biotin 5000 MCG TABS Take 5,000 mcg by mouth daily.     potassium citrate (UROCIT-K) 10 MEQ (1080 MG) SR tablet Take 20 mEq by mouth 2 (two) times daily. BREAKFAST AND BEDTIME      Pertinent medications related to GI and procedure were reviewed by me with the patient prior to the procedure   Current Facility-Administered Medications:    0.9 %  sodium chloride infusion, , Intravenous, Continuous, Jaynie Collins, DO, Last Rate: 20 mL/hr at 07/22/22 0714, 20 mL/hr at 07/22/22 4098  sodium chloride 20 mL/hr (07/22/22 0714)       Allergies  Allergen Reactions   Doxycycline Other  (See Comments)    Doxycycline-induced pancreatitis   Adhesive [Tape] Dermatitis    PLEASE ONLY USE PAPER TAPE   Codeine Itching   Penicillins Rash    Has patient had a PCN reaction causing immediate rash, facial/tongue/throat swelling, SOB or lightheadedness with hypotension: yes Has patient had a PCN reaction causing severe rash involving mucus membranes or skin necrosis: no Has patient had a PCN reaction that required hospitalization no Has patient had a PCN reaction occurring within the last 10 years: no If all of the above answers are "NO", then may proceed with Cephalosporin use.    Allergies were reviewed by me prior to the procedure  Objective   Body mass index is 33.79 kg/m. Vitals:   07/22/22 0704  BP: (!) 138/56  Pulse: 85  Resp: 20  Temp: (!) 96.1 F (35.6 C)  TempSrc: Temporal  SpO2: 97%  Weight: 78.5 kg  Height: 5' (1.524 m)     Physical  Exam Vitals and nursing note reviewed.  Constitutional:      General: She is not in acute distress.    Appearance: Normal appearance. She is obese. She is not ill-appearing, toxic-appearing or diaphoretic.  HENT:     Head: Normocephalic and atraumatic.     Nose: Nose normal.     Mouth/Throat:     Mouth: Mucous membranes are moist.     Pharynx: Oropharynx is clear.  Eyes:     General: No scleral icterus.    Extraocular Movements: Extraocular movements intact.  Cardiovascular:     Rate and Rhythm: Normal rate and regular rhythm.     Heart sounds: Normal heart sounds. No murmur heard.    No friction rub. No gallop.  Pulmonary:     Effort: Pulmonary effort is normal. No respiratory distress.     Breath sounds: Normal breath sounds. No wheezing, rhonchi or rales.  Abdominal:     General: Bowel sounds are normal. There is no distension.     Palpations: Abdomen is soft.     Tenderness: There is no abdominal tenderness. There is no guarding or rebound.  Musculoskeletal:     Cervical back: Neck supple.     Right lower  leg: No edema.     Left lower leg: No edema.  Skin:    General: Skin is warm and dry.     Coloration: Skin is not jaundiced or pale.  Neurological:     General: No focal deficit present.     Mental Status: She is alert and oriented to person, place, and time. Mental status is at baseline.  Psychiatric:        Mood and Affect: Mood normal.        Behavior: Behavior normal.        Thought Content: Thought content normal.        Judgment: Judgment normal.      Assessment:  Alexa Abbott is a 72 y.o. female  who presents today for Esophagogastroduodenoscopy and Colonoscopy for Diarrhea, personal history of colon polyps, dysphagia .  Plan:  Esophagogastroduodenoscopy and Colonoscopy with possible intervention today  Esophagogastroduodenoscopy and Colonoscopy with possible biopsy, control of bleeding, polypectomy, and interventions as necessary has been discussed with the patient/patient representative. Informed consent was obtained from the patient/patient representative after explaining the indication, nature, and risks of the procedure including but not limited to death, bleeding, perforation, missed neoplasm/lesions, cardiorespiratory compromise, and reaction to medications. Opportunity for questions was given and appropriate answers were provided. Patient/patient representative has verbalized understanding is amenable to undergoing the procedure.   Jaynie Collins, DO  [facility]  Portions of the record may have been created with voice recognition software. Occasional wrong-word or 'sound-a-like' substitutions may have occurred due to the inherent limitations of voice recognition software.  Read the chart carefully and recognize, using context, where substitutions may have occurred.

## 2022-07-22 ENCOUNTER — Encounter
Admission: RE | Disposition: A | Discharge status: Home / Self Care | Payer: Self-pay | Source: Home / Self Care | Attending: Gastroenterology

## 2022-07-22 ENCOUNTER — Ambulatory Visit
Admission: RE | Admit: 2022-07-22 | Discharge: 2022-07-22 | Disposition: A | Discharge status: Home / Self Care | Payer: Medicare Other | Attending: Gastroenterology | Admitting: Gastroenterology

## 2022-07-22 ENCOUNTER — Ambulatory Visit: Payer: Medicare Other | Admitting: Anesthesiology

## 2022-07-22 ENCOUNTER — Encounter: Payer: Self-pay | Admitting: Gastroenterology

## 2022-07-22 DIAGNOSIS — Z86010 Personal history of colonic polyps: Secondary | ICD-10-CM | POA: Insufficient documentation

## 2022-07-22 DIAGNOSIS — Z8379 Family history of other diseases of the digestive system: Secondary | ICD-10-CM | POA: Insufficient documentation

## 2022-07-22 DIAGNOSIS — Z90710 Acquired absence of both cervix and uterus: Secondary | ICD-10-CM | POA: Diagnosis not present

## 2022-07-22 DIAGNOSIS — Z1211 Encounter for screening for malignant neoplasm of colon: Secondary | ICD-10-CM | POA: Diagnosis present

## 2022-07-22 DIAGNOSIS — K640 First degree hemorrhoids: Secondary | ICD-10-CM | POA: Diagnosis not present

## 2022-07-22 DIAGNOSIS — R1310 Dysphagia, unspecified: Secondary | ICD-10-CM | POA: Diagnosis not present

## 2022-07-22 DIAGNOSIS — Z9049 Acquired absence of other specified parts of digestive tract: Secondary | ICD-10-CM | POA: Insufficient documentation

## 2022-07-22 DIAGNOSIS — K5730 Diverticulosis of large intestine without perforation or abscess without bleeding: Secondary | ICD-10-CM | POA: Insufficient documentation

## 2022-07-22 DIAGNOSIS — K2980 Duodenitis without bleeding: Secondary | ICD-10-CM | POA: Diagnosis not present

## 2022-07-22 DIAGNOSIS — Z7984 Long term (current) use of oral hypoglycemic drugs: Secondary | ICD-10-CM | POA: Insufficient documentation

## 2022-07-22 DIAGNOSIS — K2970 Gastritis, unspecified, without bleeding: Secondary | ICD-10-CM | POA: Diagnosis not present

## 2022-07-22 DIAGNOSIS — Z09 Encounter for follow-up examination after completed treatment for conditions other than malignant neoplasm: Secondary | ICD-10-CM | POA: Diagnosis not present

## 2022-07-22 DIAGNOSIS — E119 Type 2 diabetes mellitus without complications: Secondary | ICD-10-CM | POA: Insufficient documentation

## 2022-07-22 DIAGNOSIS — K2100 Gastro-esophageal reflux disease with esophagitis, without bleeding: Secondary | ICD-10-CM | POA: Diagnosis not present

## 2022-07-22 DIAGNOSIS — D120 Benign neoplasm of cecum: Secondary | ICD-10-CM | POA: Diagnosis not present

## 2022-07-22 HISTORY — PX: ESOPHAGOGASTRODUODENOSCOPY (EGD) WITH PROPOFOL: SHX5813

## 2022-07-22 HISTORY — PX: COLONOSCOPY WITH PROPOFOL: SHX5780

## 2022-07-22 LAB — GLUCOSE, CAPILLARY: Glucose-Capillary: 135 mg/dL — ABNORMAL HIGH (ref 70–99)

## 2022-07-22 SURGERY — COLONOSCOPY WITH PROPOFOL
Anesthesia: General

## 2022-07-22 SURGERY — COLONOSCOPY
Anesthesia: General

## 2022-07-22 MED ORDER — LIDOCAINE HCL (CARDIAC) PF 100 MG/5ML IV SOSY
PREFILLED_SYRINGE | INTRAVENOUS | Status: DC | PRN
  Administered 2022-07-22: 100 mg via INTRAVENOUS

## 2022-07-22 MED ORDER — PROPOFOL 10 MG/ML IV BOLUS
INTRAVENOUS | Status: DC | PRN
  Administered 2022-07-22: 100 mg via INTRAVENOUS
  Administered 2022-07-22: 150 ug/kg/min via INTRAVENOUS

## 2022-07-22 MED ORDER — PROPOFOL 10 MG/ML IV BOLUS
INTRAVENOUS | Status: AC
  Filled 2022-07-22: qty 40

## 2022-07-22 MED ORDER — PHENYLEPHRINE HCL (PRESSORS) 10 MG/ML IV SOLN
INTRAVENOUS | Status: DC | PRN
  Administered 2022-07-22 (×2): 80 ug via INTRAVENOUS

## 2022-07-22 MED ORDER — PHENYLEPHRINE 80 MCG/ML (10ML) SYRINGE FOR IV PUSH (FOR BLOOD PRESSURE SUPPORT)
PREFILLED_SYRINGE | INTRAVENOUS | Status: AC
  Filled 2022-07-22: qty 10

## 2022-07-22 MED ORDER — LIDOCAINE HCL (PF) 2 % IJ SOLN
INTRAMUSCULAR | Status: AC
  Filled 2022-07-22: qty 5

## 2022-07-22 MED ORDER — SODIUM CHLORIDE 0.9 % IV SOLN
INTRAVENOUS | Status: DC
  Administered 2022-07-22: 20 mL/h via INTRAVENOUS

## 2022-07-22 MED ORDER — STERILE WATER FOR IRRIGATION IR SOLN
Status: DC | PRN
  Administered 2022-07-22: 60 mL

## 2022-07-22 NOTE — Op Note (Signed)
[facility] Patient Name: Alexa Abbott Procedure Date: 07/22/2022 7:08 AM MRN: [id number] Account #: [id number] Date of Birth: 05-18-1950 Admit Type: Outpatient Age: 72 Room: [facility] ENDO ROOM 1 Gender: Female Note Status: Finalized Instrument Name: Colonoscope [id number] Procedure:             Colonoscopy Indications:           High risk colon cancer surveillance: Personal history                         of colonic polyps Providers:             Jaynie Collins DO, DO Referring MD:          No Local Md, MD (Referring MD) Medicines:             Monitored Anesthesia Care Complications:         No immediate complications. Estimated blood loss:                         Minimal. Procedure:             Pre-Anesthesia Assessment:                        - Prior to the procedure, a History and Physical was                         performed, and patient medications and allergies were                         reviewed. The patient is competent. The risks and                         benefits of the procedure and the sedation options and                         risks were discussed with the patient. All questions                         were answered and informed consent was obtained.                         Patient identification and proposed procedure were                         verified by the physician, the nurse, the anesthetist                         and the technician in the endoscopy suite. Mental                         Status Examination: alert and oriented. Airway                         Examination: normal oropharyngeal airway and neck                         mobility. Respiratory Examination: clear to  auscultation. CV Examination: RRR, no murmurs, no S3                         or S4. Prophylactic Antibiotics: The patient does not                         require prophylactic antibiotics. Prior                          Anticoagulants: The patient has taken no anticoagulant                         or antiplatelet agents. ASA Grade Assessment: II - A                         patient with mild systemic disease. After reviewing                         the risks and benefits, the patient was deemed in                         satisfactory condition to undergo the procedure. The                         anesthesia plan was to use monitored anesthesia care                         (MAC). Immediately prior to administration of                         medications, the patient was re-assessed for adequacy                         to receive sedatives. The heart rate, respiratory                         rate, oxygen saturations, blood pressure, adequacy of                         pulmonary ventilation, and response to care were                         monitored throughout the procedure. The physical                         status of the patient was re-assessed after the                         procedure.                        After obtaining informed consent, the colonoscope was                         passed under direct vision. Throughout the procedure,                         the patient's blood pressure, pulse, and oxygen  saturations were monitored continuously. The                         Colonoscope was introduced through the anus and                         advanced to the the terminal ileum, with                         identification of the appendiceal orifice and IC                         valve. The colonoscopy was performed without                         difficulty. The patient tolerated the procedure well.                         The quality of the bowel preparation was evaluated                         using the BBPS [facility] Bowel Preparation Scale) with                         scores of: Right Colon = 2 (minor amount of residual                         staining, small fragments of  stool and/or opaque                         liquid, but mucosa seen well), Transverse Colon = 2                         (minor amount of residual staining, small fragments of                         stool and/or opaque liquid, but mucosa seen well) and                         Left Colon = 2 (minor amount of residual staining,                         small fragments of stool and/or opaque liquid, but                         mucosa seen well). The total BBPS score equals 6. The                         quality of the bowel preparation was good. The                         terminal ileum, ileocecal valve, appendiceal orifice,                         and rectum were photographed. Findings:      The perianal and digital rectal examinations were normal. Pertinent       negatives include normal  sphincter tone.      The terminal ileum appeared normal. Estimated blood loss: none.      A 1 to 2 mm polyp was found in the cecum. The polyp was sessile. The       polyp was removed with a jumbo cold forceps. Resection and retrieval       were complete. Estimated blood loss was minimal.      Normal mucosa was found in the entire colon. Biopsies for histology were       taken with a cold forceps from the right colon and left colon for       evaluation of microscopic colitis. Estimated blood loss was minimal.      Non-bleeding internal hemorrhoids were found during retroflexion. The       hemorrhoids were Grade I (internal hemorrhoids that do not prolapse).       Estimated blood loss: none.      Multiple small-mouthed diverticula were found in the left colon.       Estimated blood loss: none.      The exam was otherwise without abnormality on direct and retroflexion       views. Impression:            - The examined portion of the ileum was normal.                        - One 1 to 2 mm polyp in the cecum, removed with a                         jumbo cold forceps. Resected and retrieved.                         - Normal mucosa in the entire examined colon. Biopsied.                        - Non-bleeding internal hemorrhoids.                        - Diverticulosis in the left colon.                        - The examination was otherwise normal on direct and                         retroflexion views. Recommendation:        - Patient has a contact number available for                         emergencies. The signs and symptoms of potential                         delayed complications were discussed with the patient.                         Return to normal activities tomorrow. Written                         discharge instructions were provided to the patient.                        -  Discharge patient to home.                        - Resume previous diet.                        - Continue present medications.                        - Await pathology results.                        - Repeat colonoscopy for surveillance based on                         pathology results.                        - Return to referring physician as previously                         scheduled. Procedure Code(s):     --- Professional ---                        [phone], Colonoscopy, flexible; with biopsy, single or                         multiple Diagnosis Code(s):     --- Professional ---                        Z86.010, Personal history of colonic polyps                        K64.0, First degree hemorrhoids                        D12.0, Benign neoplasm of cecum                        K57.30, Diverticulosis of large intestine without                         perforation or abscess without bleeding CPT copyright 2022 American Medical Association. All rights reserved. The codes documented in this report are preliminary and upon coder review may  be revised to meet current compliance requirements. Attending Participation:      I personally performed the entire procedure. Elfredia Nevins, DO Jaynie Collins DO,  DO 07/22/2022 8:13:56 AM This report has been signed electronically. Number of Addenda: 0 Note Initiated On: 07/22/2022 7:08 AM Scope Withdrawal Time: 0 hours 10 minutes 52 seconds  Total Procedure Duration: 0 hours 15 minutes 36 seconds  Estimated Blood Loss:  Estimated blood loss was minimal.      [facility]

## 2022-07-22 NOTE — Interval H&P Note (Signed)
History and Physical Interval Note: Preprocedure H&P from 07/22/22  was reviewed and there was no interval change after seeing and examining the patient.  Written consent was obtained from the patient after discussion of risks, benefits, and alternatives. Patient has consented to proceed with Esophagogastroduodenoscopy and Colonoscopy with possible intervention   07/22/2022 7:23 AM  Alexa Abbott  has presented today for surgery, with the diagnosis of V12.72 (ICD-9-CM) - Z86.010 (ICD-10-CM) - History of colon polyps 787.91 (ICD-9-CM) - R19.7 (ICD-10-CM) - Diarrhea, unspecified type 787.20 (ICD-9-CM) - R13.10 (ICD-10-CM) - Dysphagia, unspecified type.  The various methods of treatment have been discussed with the patient and family. After consideration of risks, benefits and other options for treatment, the patient has consented to  Procedure(s): COLONOSCOPY WITH PROPOFOL (N/A) ESOPHAGOGASTRODUODENOSCOPY (EGD) WITH PROPOFOL (N/A) as a surgical intervention.  The patient's history has been reviewed, patient examined, no change in status, stable for surgery.  I have reviewed the patient's chart and labs.  Questions were answered to the patient's satisfaction.     Jaynie Collins

## 2022-07-22 NOTE — Transfer of Care (Signed)
Immediate Anesthesia Transfer of Care Note  Patient: Alexa Abbott  Procedure(s) Performed: COLONOSCOPY WITH PROPOFOL ESOPHAGOGASTRODUODENOSCOPY (EGD) WITH PROPOFOL  Patient Location: Endoscopy Unit  Anesthesia Type:General  Level of Consciousness: drowsy  Airway & Oxygen Therapy: Patient Spontanous Breathing  Post-op Assessment: Report given to RN and Post -op Vital signs reviewed and stable  Post vital signs: Reviewed and stable  Last Vitals:  Vitals Value Taken Time  BP 98/56 07/22/22 0816  Temp 36.1 C 07/22/22 0810  Pulse 70 07/22/22 0815  Resp 17 07/22/22 0815  SpO2 97 % 07/22/22 0815    Last Pain:  Vitals:   07/22/22 0810  TempSrc: Temporal  PainSc: Asleep         Complications: No notable events documented.

## 2022-07-22 NOTE — Op Note (Signed)
[facility] Gastroenterology Patient Name: Alexa Abbott Procedure Date: 07/22/2022 7:08 AM MRN: [id number] Account #: [id number] Date of Birth: 31-Oct-1950 Admit Type: Outpatient Age: 72 Room: [facility] ENDO ROOM 1 Gender: Female Note Status: Finalized Instrument Name: Upper Endoscope [id number] Procedure:             Upper GI endoscopy Indications:           Dysphagia Providers:             Jaynie Collins DO, DO Referring MD:          No Local Md, MD (Referring MD) Medicines:             Monitored Anesthesia Care Complications:         No immediate complications. Estimated blood loss:                         Minimal. Procedure:             Pre-Anesthesia Assessment:                        - Prior to the procedure, a History and Physical was                         performed, and patient medications and allergies were                         reviewed. The patient is competent. The risks and                         benefits of the procedure and the sedation options and                         risks were discussed with the patient. All questions                         were answered and informed consent was obtained.                         Patient identification and proposed procedure were                         verified by the physician, the nurse, the anesthetist                         and the technician in the endoscopy suite. Mental                         Status Examination: alert and oriented. Airway                         Examination: normal oropharyngeal airway and neck                         mobility. Respiratory Examination: clear to                         auscultation. CV Examination: RRR, no murmurs, no S3  or S4. Prophylactic Antibiotics: The patient does not                         require prophylactic antibiotics. Prior                         Anticoagulants: The patient has taken no anticoagulant                          or antiplatelet agents. ASA Grade Assessment: II - A                         patient with mild systemic disease. After reviewing                         the risks and benefits, the patient was deemed in                         satisfactory condition to undergo the procedure. The                         anesthesia plan was to use monitored anesthesia care                         (MAC). Immediately prior to administration of                         medications, the patient was re-assessed for adequacy                         to receive sedatives. The heart rate, respiratory                         rate, oxygen saturations, blood pressure, adequacy of                         pulmonary ventilation, and response to care were                         monitored throughout the procedure. The physical                         status of the patient was re-assessed after the                         procedure.                        After obtaining informed consent, the endoscope was                         passed under direct vision. Throughout the procedure,                         the patient's blood pressure, pulse, and oxygen                         saturations were monitored continuously. The Endoscope  was introduced through the mouth, and advanced to the                         second part of duodenum. The upper GI endoscopy was                         accomplished without difficulty. The patient tolerated                         the procedure well. Findings:      Localized mild inflammation characterized by erythema was found in the       duodenal bulb. Biopsies for histology were taken with a cold forceps for       evaluation of celiac disease. Estimated blood loss was minimal.      Localized mild inflammation characterized by erythema and granularity       was found in the cardia. Biopsies were taken with a cold forceps for       histology. Estimated blood loss was  minimal.      Normal mucosa was found in the entire examined stomach. Biopsies were       taken with a cold forceps for Helicobacter pylori testing. Estimated       blood loss was minimal.      The Z-line was regular. Estimated blood loss: none.      Esophagogastric landmarks were identified: the gastroesophageal junction       was found at 36 cm from the incisors.      LA Grade A (one or more mucosal breaks less than 5 mm, not extending       between tops of 2 mucosal folds) esophagitis with no bleeding was found.       Estimated blood loss: none. The scope was withdrawn. Dilation was       performed with a Maloney dilator with no resistance at 48 Fr. The       dilation site was examined following endoscope reinsertion and showed no       change. Estimated blood loss: none.      The exam of the esophagus was otherwise normal. Impression:            - Duodenitis. Biopsied.                        - Gastritis. Biopsied.                        - Normal mucosa was found in the entire stomach.                         Biopsied.                        - Z-line regular.                        - Esophagogastric landmarks identified.                        - LA Grade A reflux esophagitis with no bleeding.                         Dilated. Recommendation:        -  Patient has a contact number available for                         emergencies. The signs and symptoms of potential                         delayed complications were discussed with the patient.                         Return to normal activities tomorrow. Written                         discharge instructions were provided to the patient.                        - Discharge patient to home.                        - Soft diet.                        - Continue present medications.                        - consider acid suppressive therapy if not already                         taking                        - Await pathology results.                         - Return to GI clinic as previously scheduled.                        - proceed with colonoscopy                        - The findings and recommendations were discussed with                         the patient. Procedure Code(s):     --- Professional ---                        [phone], Esophagogastroduodenoscopy, flexible,                         transoral; with biopsy, single or multiple                        43450, Dilation of esophagus, by unguided sound or                         bougie, single or multiple passes Diagnosis Code(s):     --- Professional ---                        K29.80, Duodenitis without bleeding                        K29.70, Gastritis, unspecified, without bleeding  K21.00, Gastro-esophageal reflux disease with                         esophagitis, without bleeding                        R13.10, Dysphagia, unspecified CPT copyright 2022 American Medical Association. All rights reserved. The codes documented in this report are preliminary and upon coder review may  be revised to meet current compliance requirements. Attending Participation:      I personally performed the entire procedure. Elfredia Nevins, DO Jaynie Collins DO, DO 07/22/2022 7:48:58 AM This report has been signed electronically. Number of Addenda: 0 Note Initiated On: 07/22/2022 7:08 AM Estimated Blood Loss:  Estimated blood loss was minimal.      [facility]

## 2022-07-22 NOTE — Anesthesia Preprocedure Evaluation (Signed)
Anesthesia Evaluation  Patient identified by MRN, date of birth, ID band Patient awake    Reviewed: Allergy & Precautions, NPO status , Patient's Chart, lab work & pertinent test results  History of Anesthesia Complications (+) PONV and history of anesthetic complications  Airway Mallampati: II  TM Distance: >3 FB Neck ROM: full    Dental  (+) Teeth Intact   Pulmonary neg pulmonary ROS, sleep apnea , former smoker   Pulmonary exam normal breath sounds clear to auscultation       Cardiovascular Exercise Tolerance: Good negative cardio ROS Normal cardiovascular exam Rhythm:Regular Rate:Normal     Neuro/Psych negative neurological ROS  negative psych ROS   GI/Hepatic negative GI ROS, Neg liver ROS,GERD  Medicated,,  Endo/Other  negative endocrine ROSdiabetes, Well Controlled, Type 2, Oral Hypoglycemic Agents    Renal/GU negative Renal ROS  negative genitourinary   Musculoskeletal  (+) Arthritis ,    Abdominal  (+) + obese  Peds negative pediatric ROS (+)  Hematology negative hematology ROS (+) Blood dyscrasia, anemia   Anesthesia Other Findings Past Medical History: No date: Abdominal pain, generalized No date: Anemia No date: Asthma No date: Complication of anesthesia 10/09/2014: DDD (degenerative disc disease), lumbar 07/25/2013: Diabetes (HCC) No date: Diabetes mellitus without complication (HCC) No date: GERD (gastroesophageal reflux disease) No date: Hyperlipidemia 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 date: IBS (irritable bowel syndrome) No date: Ileus (HCC) 01/01/2015: 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 No date: Kidney stone     Comment:  stones in right kidney 06/06/2012: Low urine output 10/09/2014: Numbness in right leg 09/2018: Pancreatitis     Comment:  required hospitalization No date: Plantar fasciitis No date: PONV (postoperative nausea and vomiting) No date: Renal  insufficiency     Comment:  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 10/22/2015: Sacroiliac joint pain No date: SBO (small bowel obstruction) (HCC) 09/2018: Sleep apnea     Comment:  needs a cpap but waiting for 2nd test 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 No date: Type 2 diabetes mellitus (HCC)  Past Surgical History: No date: ABDOMINAL HYSTERECTOMY No date: APPENDECTOMY No date: BACK SURGERY 12/21/2018: CATARACT EXTRACTION W/PHACO; Left     Comment:  Procedure: CATARACT EXTRACTION PHACO AND INTRAOCULAR               LENS PLACEMENT (IOC) LEFT DIABETIC TORIC LENS;  Surgeon:               Galen Manila, MD;  Location: [facility] ORS;  Service:               Ophthalmology;  Laterality: Left;  [place] 00:28.9 CDE               4.47 Fluid Pack Lot # [id number] 03/19/2019: CATARACT EXTRACTION W/PHACO; Right     Comment:  Procedure: CATARACT EXTRACTION PHACO AND INTRAOCULAR               LENS PLACEMENT (IOC) RIGHT TORIC LENS DIABETIC 5.46               00:42.1;  Surgeon: Galen Manila, MD;  Location:               [facility];  Service: Ophthalmology;                Laterality: Right;  Diabetic - oral meds No date: CHOLECYSTECTOMY No date: COLONOSCOPY No date: EYE SURGERY 08/14/2017: LAPAROTOMY; N/A     Comment:  Procedure: EXPLORATORY  LAPAROTOMY;  Surgeon: Leafy Ro, MD;  Location: [facility] ORS;  Service: General;                Laterality: N/A; 2016, 2018: MICRODISCECTOMY LUMBAR No date: PILONIDAL CYST EXCISION     Reproductive/Obstetrics negative OB ROS                             Anesthesia Physical Anesthesia Plan  ASA: 2  Anesthesia Plan: General   Post-op Pain Management:    Induction: Intravenous  PONV Risk Score and Plan: 1 and Ondansetron  Airway Management Planned: Natural Airway  Additional Equipment:   Intra-op Plan:   Post-operative Plan:   Informed Consent: I have reviewed the  patients History and Physical, chart, labs and discussed the procedure including the risks, benefits and alternatives for the proposed anesthesia with the patient or authorized representative who has indicated his/her understanding and acceptance.     Dental Advisory Given  Plan Discussed with: CRNA and Surgeon  Anesthesia Plan Comments:        Anesthesia Quick Evaluation

## 2022-07-22 NOTE — Anesthesia Postprocedure Evaluation (Signed)
Anesthesia Post Note  Patient: Alexa Abbott  Procedure(s) Performed: COLONOSCOPY WITH PROPOFOL ESOPHAGOGASTRODUODENOSCOPY (EGD) WITH PROPOFOL  Patient location during evaluation: PACU Anesthesia Type: General Level of consciousness: awake and awake and alert Pain management: satisfactory to patient Vital Signs Assessment: post-procedure vital signs reviewed and stable Respiratory status: spontaneous breathing and nonlabored ventilation Cardiovascular status: stable Anesthetic complications: no   No notable events documented.   Last Vitals:  Vitals:   07/22/22 0821 07/22/22 0831  BP: (!) 92/37 (!) 119/55  Pulse: 73 69  Resp: 19 14  Temp:    SpO2: 95% 98%    Last Pain:  Vitals:   07/22/22 0831  TempSrc:   PainSc: 0-No pain                 VAN STAVEREN,Riva Sesma

## 2022-07-26 ENCOUNTER — Encounter: Payer: Self-pay | Admitting: Gastroenterology

## 2023-02-07 ENCOUNTER — Ambulatory Visit: Payer: Medicare Other | Attending: Otolaryngology

## 2023-02-07 DIAGNOSIS — G4761 Periodic limb movement disorder: Secondary | ICD-10-CM | POA: Diagnosis not present

## 2023-02-07 DIAGNOSIS — G4752 REM sleep behavior disorder: Secondary | ICD-10-CM | POA: Insufficient documentation

## 2023-02-07 DIAGNOSIS — G4733 Obstructive sleep apnea (adult) (pediatric): Secondary | ICD-10-CM | POA: Insufficient documentation

## 2023-03-02 ENCOUNTER — Other Ambulatory Visit: Payer: Self-pay | Admitting: Family Medicine

## 2023-03-02 DIAGNOSIS — Z1231 Encounter for screening mammogram for malignant neoplasm of breast: Secondary | ICD-10-CM

## 2023-03-09 ENCOUNTER — Ambulatory Visit
Admission: RE | Admit: 2023-03-09 | Discharge: 2023-03-09 | Disposition: A | Discharge status: Home / Self Care | Payer: Medicare Other | Source: Ambulatory Visit | Attending: Family Medicine | Admitting: Family Medicine

## 2023-03-09 DIAGNOSIS — Z1231 Encounter for screening mammogram for malignant neoplasm of breast: Secondary | ICD-10-CM | POA: Diagnosis present

## 2023-11-27 ENCOUNTER — Encounter
Admission: RE | Disposition: A | Discharge status: Home / Self Care | Payer: Self-pay | Source: Home / Self Care | Attending: Gastroenterology

## 2023-11-27 ENCOUNTER — Ambulatory Visit: Admitting: Anesthesiology

## 2023-11-27 ENCOUNTER — Encounter: Payer: Self-pay | Admitting: Gastroenterology

## 2023-11-27 ENCOUNTER — Ambulatory Visit
Admission: RE | Admit: 2023-11-27 | Discharge: 2023-11-27 | Disposition: A | Discharge status: Home / Self Care | Attending: Gastroenterology | Admitting: Gastroenterology

## 2023-11-27 DIAGNOSIS — J45909 Unspecified asthma, uncomplicated: Secondary | ICD-10-CM | POA: Insufficient documentation

## 2023-11-27 DIAGNOSIS — Z87891 Personal history of nicotine dependence: Secondary | ICD-10-CM | POA: Insufficient documentation

## 2023-11-27 DIAGNOSIS — Z6834 Body mass index (BMI) 34.0-34.9, adult: Secondary | ICD-10-CM | POA: Insufficient documentation

## 2023-11-27 DIAGNOSIS — E119 Type 2 diabetes mellitus without complications: Secondary | ICD-10-CM | POA: Insufficient documentation

## 2023-11-27 DIAGNOSIS — G4730 Sleep apnea, unspecified: Secondary | ICD-10-CM | POA: Insufficient documentation

## 2023-11-27 DIAGNOSIS — E669 Obesity, unspecified: Secondary | ICD-10-CM | POA: Insufficient documentation

## 2023-11-27 DIAGNOSIS — Z7985 Long-term (current) use of injectable non-insulin antidiabetic drugs: Secondary | ICD-10-CM | POA: Diagnosis not present

## 2023-11-27 DIAGNOSIS — R1310 Dysphagia, unspecified: Secondary | ICD-10-CM | POA: Diagnosis present

## 2023-11-27 DIAGNOSIS — K2100 Gastro-esophageal reflux disease with esophagitis, without bleeding: Secondary | ICD-10-CM | POA: Insufficient documentation

## 2023-11-27 DIAGNOSIS — D649 Anemia, unspecified: Secondary | ICD-10-CM | POA: Diagnosis not present

## 2023-11-27 DIAGNOSIS — K224 Dyskinesia of esophagus: Secondary | ICD-10-CM | POA: Insufficient documentation

## 2023-11-27 DIAGNOSIS — K2970 Gastritis, unspecified, without bleeding: Secondary | ICD-10-CM | POA: Insufficient documentation

## 2023-11-27 DIAGNOSIS — E785 Hyperlipidemia, unspecified: Secondary | ICD-10-CM | POA: Insufficient documentation

## 2023-11-27 DIAGNOSIS — Z79899 Other long term (current) drug therapy: Secondary | ICD-10-CM | POA: Insufficient documentation

## 2023-11-27 DIAGNOSIS — Z7984 Long term (current) use of oral hypoglycemic drugs: Secondary | ICD-10-CM | POA: Insufficient documentation

## 2023-11-27 HISTORY — PX: ESOPHAGOGASTRODUODENOSCOPY: SHX5428

## 2023-11-27 HISTORY — PX: MALONEY DILATION: SHX5535

## 2023-11-27 LAB — GLUCOSE, CAPILLARY: Glucose-Capillary: 122 mg/dL — ABNORMAL HIGH (ref 70–99)

## 2023-11-27 SURGERY — EGD (ESOPHAGOGASTRODUODENOSCOPY)
Anesthesia: General

## 2023-11-27 MED ORDER — ONDANSETRON HCL 4 MG/2ML IJ SOLN
INTRAMUSCULAR | Status: AC
  Filled 2023-11-27: qty 2

## 2023-11-27 MED ORDER — LIDOCAINE HCL (CARDIAC) PF 100 MG/5ML IV SOSY
PREFILLED_SYRINGE | INTRAVENOUS | Status: DC | PRN
  Administered 2023-11-27: 80 mg via INTRAVENOUS

## 2023-11-27 MED ORDER — PROPOFOL 10 MG/ML IV BOLUS
INTRAVENOUS | Status: DC | PRN
  Administered 2023-11-27: 100 mg via INTRAVENOUS
  Administered 2023-11-27: 50 mg via INTRAVENOUS

## 2023-11-27 MED ORDER — SODIUM CHLORIDE 0.9 % IV SOLN: INTRAVENOUS | Status: DC

## 2023-11-27 MED ORDER — PROPOFOL 1000 MG/100ML IV EMUL
INTRAVENOUS | Status: AC
  Filled 2023-11-27: qty 100

## 2023-11-27 MED ORDER — LIDOCAINE HCL (PF) 2 % IJ SOLN
INTRAMUSCULAR | Status: AC
  Filled 2023-11-27: qty 5

## 2023-11-27 NOTE — Transfer of Care (Signed)
 Immediate Anesthesia Transfer of Care Note  Patient: Alexa Abbott  Procedure(s) Performed: EGD (ESOPHAGOGASTRODUODENOSCOPY) DILATION, ESOPHAGUS, USING MALONEY DILATOR  Patient Location: PACU  Anesthesia Type:General  Level of Consciousness: awake and sedated  Airway & Oxygen Therapy: Patient Spontanous Breathing and Patient connected to face mask oxygen  Post-op Assessment: Report given to RN and Post -op Vital signs reviewed and stable  Post vital signs: Reviewed and stable  Last Vitals:  Vitals Value Taken Time  BP    Temp    Pulse    Resp    SpO2      Last Pain:  Vitals:   11/27/23 0830  TempSrc: Temporal  PainSc: 0-No pain         Complications: There were no known notable events for this encounter.

## 2023-11-27 NOTE — Interval H&P Note (Signed)
 History and Physical Interval Note: Preprocedure H&P from 11/27/23  was reviewed and there was no interval change after seeing and examining the patient.  Written consent was obtained from the patient after discussion of risks, benefits, and alternatives. Patient has consented to proceed with Esophagogastroduodenoscopy with possible intervention   11/27/2023 8:46 AM  Alexa Abbott  has presented today for surgery, with the diagnosis of Dysphagia, unspecified type [R13.10]   Gastroesophageal reflux disease, unspecified whether esophagitis present [K21.9].  The various methods of treatment have been discussed with the patient and family. After consideration of risks, benefits and other options for treatment, the patient has consented to  Procedure(s) with comments: EGD (ESOPHAGOGASTRODUODENOSCOPY) (N/A) - DM-Ozempic as a surgical intervention.  The patient's history has been reviewed, patient examined, no change in status, stable for surgery.  I have reviewed the patient's chart and labs.  Questions were answered to the patient's satisfaction.     [name]

## 2023-11-27 NOTE — Anesthesia Postprocedure Evaluation (Signed)
 Anesthesia Post Note  Patient: Alexa Abbott  Procedure(s) Performed: EGD (ESOPHAGOGASTRODUODENOSCOPY) DILATION, ESOPHAGUS, USING MALONEY DILATOR  Patient location during evaluation: PACU Anesthesia Type: General Level of consciousness: awake Pain management: satisfactory to patient Vital Signs Assessment: post-procedure vital signs reviewed and stable Respiratory status: spontaneous breathing Cardiovascular status: stable Anesthetic complications: no   There were no known notable events for this encounter.   Last Vitals:  Vitals:   11/27/23 0904 11/27/23 0924  BP: 108/60 (!) 109/54  Pulse: 77   Resp: 20 19  Temp:    SpO2: 100% 96%    Last Pain:  Vitals:   11/27/23 0924  TempSrc:   PainSc: 0-No pain                 VAN STAVEREN,Johnnathan Hagemeister

## 2023-11-27 NOTE — Op Note (Signed)
 [facility] Gastroenterology Patient Name: Alexa Abbott Procedure Date: 11/27/2023 8:52 AM MRN: [id number] Account #: [id number] Date of Birth: Feb 17, 1951 Admit Type: Outpatient Age: 73 Room: [facility] ENDO ROOM 2 Gender: Female Note Status: Finalized Instrument Name: Barnie GI Scope [id number] Procedure:             Upper GI endoscopy Indications:           Dysphagia, GERD Providers:             Elspeth Ozell Onita ROSALEA, DO Referring MD:          Alm HERO. Glover, MD (Referring MD) Medicines:             Monitored Anesthesia Care Complications:         No immediate complications. Estimated blood loss: None. Procedure:             Pre-Anesthesia Assessment:                        - Prior to the procedure, a History and Physical was                         performed, and patient medications and allergies were                         reviewed. The patient is competent. The risks and                         benefits of the procedure and the sedation options and                         risks were discussed with the patient. All questions                         were answered and informed consent was obtained.                         Patient identification and proposed procedure were                         verified by the physician, the nurse, the anesthetist                         and the technician in the endoscopy suite. Mental                         Status Examination: alert and oriented. Airway                         Examination: normal oropharyngeal airway and neck                         mobility. Respiratory Examination: clear to                         auscultation. CV Examination: RRR, no murmurs, no S3                         or S4. Prophylactic Antibiotics: The patient does not  require prophylactic antibiotics. Prior                         Anticoagulants: The patient has taken no anticoagulant                         or antiplatelet  agents. ASA Grade Assessment: III - A                         patient with severe systemic disease. After reviewing                         the risks and benefits, the patient was deemed in                         satisfactory condition to undergo the procedure. The                         anesthesia plan was to use monitored anesthesia care                         (MAC). Immediately prior to administration of                         medications, the patient was re-assessed for adequacy                         to receive sedatives. The heart rate, respiratory                         rate, oxygen saturations, blood pressure, adequacy of                         pulmonary ventilation, and response to care were                         monitored throughout the procedure. The physical                         status of the patient was re-assessed after the                         procedure.                        After obtaining informed consent, the endoscope was                         passed under direct vision. Throughout the procedure,                         the patient's blood pressure, pulse, and oxygen                         saturations were monitored continuously. The Endoscope                         was introduced through the mouth, and advanced to the  second part of duodenum. The upper GI endoscopy was                         accomplished without difficulty. The patient tolerated                         the procedure well. Findings:      The duodenal bulb, first portion of the duodenum and second portion of       the duodenum were normal. Estimated blood loss: none.      Localized mild inflammation characterized by erythema was found in the       gastric antrum. Estimated blood loss: none.      The exam of the stomach was otherwise normal.      Esophagogastric landmarks were identified: the gastroesophageal junction       was found at 35 cm from the  incisors.      The Z-line was regular. Estimated blood loss: none.      Abnormal motility was noted in the esophagus. The cricopharyngeus was       normal. There is spasticity of the esophageal body. The distal       esophagus/lower esophageal sphincter is open. The scope was withdrawn.       Dilation was performed with a Maloney dilator with no resistance at 52       Fr. The dilation site was examined following endoscope reinsertion and       showed no change. Estimated blood loss: none.      The exam of the esophagus was otherwise normal. Impression:            - Normal duodenal bulb, first portion of the duodenum                         and second portion of the duodenum.                        - Gastritis.                        - Esophagogastric landmarks identified.                        - Z-line regular.                        - Abnormal esophageal motility, suspicious for                         esophageal spasm. Dilated.                        - No specimens collected. Recommendation:        - Patient has a contact number available for                         emergencies. The signs and symptoms of potential                         delayed complications were discussed with the patient.                         Return to  normal activities tomorrow. Written                         discharge instructions were provided to the patient.                        - Discharge patient to home.                        - Resume previous diet.                        - Continue present medications.                        - No repeat EGD for dysphagia indicated (twice within                         one year without change with dilation)                        Recommend motility and manometry study                        Recommend continued workup for other etiologies of                         chest pain                        - The findings and recommendations were discussed with                          the patient. 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 All rights  reserved. The codes documented in this report are preliminary and upon coder review may  be revised to meet current compliance requirements. Attending Participation:      I personally performed the entire procedure. Elspeth Jungling, DO [name] DO, DO 11/27/2023 9:04:26 AM This report has been signed electronically. Number of Addenda: 0 Note Initiated On: 11/27/2023 8:52 AM Estimated Blood Loss:  Estimated blood loss: none.      [facility]

## 2023-11-27 NOTE — Anesthesia Preprocedure Evaluation (Addendum)
 Anesthesia Evaluation  Patient identified by MRN, date of birth, ID band Patient awake    Reviewed: Allergy & Precautions, NPO status , Patient's Chart, lab work & pertinent test results  History of Anesthesia Complications (+) PONV and history of anesthetic complications  Airway Mallampati: II  TM Distance: >3 FB Neck ROM: full    Dental  (+) Teeth Intact   Pulmonary asthma , sleep apnea and Continuous Positive Airway Pressure Ventilation , former smoker   Pulmonary exam normal  + decreased breath sounds      Cardiovascular Exercise Tolerance: Good negative cardio ROS Normal cardiovascular exam Rhythm:Regular Rate:Normal     Neuro/Psych negative neurological ROS  negative psych ROS   GI/Hepatic negative GI ROS, Neg liver ROS,GERD  ,,  Endo/Other  diabetes, Type 2, Oral Hypoglycemic Agents    Renal/GU Renal InsufficiencyRenal disease  negative genitourinary   Musculoskeletal   Abdominal  (+) + obese  Peds negative pediatric ROS (+)  Hematology negative hematology ROS (+) Blood dyscrasia, anemia   Anesthesia Other Findings Past Medical History: No date: Abdominal pain, generalized No date: Anemia No date: Asthma No date: Complication of anesthesia 10/09/2014: DDD (degenerative disc disease), lumbar 07/25/2013: Diabetes (HCC) No date: Diabetes mellitus without complication (HCC) No date: GERD (gastroesophageal reflux disease) No date: Hyperlipidemia 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 date: IBS (irritable bowel syndrome) No date: Ileus (HCC) 01/01/2015: 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 No date: Kidney stone     Comment:  stones in right kidney 06/06/2012: Low urine output 10/09/2014: Numbness in right leg 09/2018: Pancreatitis     Comment:  required hospitalization No date: Plantar fasciitis No date: PONV (postoperative nausea and vomiting) No date: Renal insufficiency     Comment:   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 10/22/2015: Sacroiliac joint pain No date: SBO (small bowel obstruction) (HCC) 09/2018: Sleep apnea     Comment:  needs a cpap but waiting for 2nd test 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 No date: Type 2 diabetes mellitus (HCC)  Past Surgical History: No date: ABDOMINAL HYSTERECTOMY No date: APPENDECTOMY No date: BACK SURGERY 12/21/2018: CATARACT EXTRACTION W/PHACO; Left     Comment:  Procedure: CATARACT EXTRACTION PHACO AND INTRAOCULAR               LENS PLACEMENT (IOC) LEFT DIABETIC TORIC LENS;  Surgeon:               Jaye Fallow, MD;  Location: [facility];  Service:               Ophthalmology;  Laterality: Left;  US  00:28.9 CDE               4.47 Fluid Pack Lot # [id number] 03/19/2019: CATARACT EXTRACTION W/PHACO; Right     Comment:  Procedure: CATARACT EXTRACTION PHACO AND INTRAOCULAR               LENS PLACEMENT (IOC) RIGHT TORIC LENS DIABETIC 5.46               00:42.1;  Surgeon: Jaye Fallow, MD;  Location:               [facility];  Service: Ophthalmology;                Laterality: Right;  Diabetic - oral meds No date: CHOLECYSTECTOMY No date: COLONOSCOPY 07/22/2022: COLONOSCOPY WITH PROPOFOL ; N/A     Comment:  Procedure: COLONOSCOPY WITH PROPOFOL ;  Surgeon: Onita,  Elspeth Sharper, DO;  Location: [facility];  Service:               Gastroenterology;  Laterality: N/A; 07/22/2022: ESOPHAGOGASTRODUODENOSCOPY (EGD) WITH PROPOFOL ; N/A     Comment:  Procedure: ESOPHAGOGASTRODUODENOSCOPY (EGD) WITH               PROPOFOL ;  Surgeon: Onita Elspeth Sharper, DO;  Location:              [facility];  Service: Gastroenterology;  Laterality:               N/A; No date: EYE SURGERY 08/14/2017: LAPAROTOMY; N/A     Comment:  Procedure: EXPLORATORY LAPAROTOMY;  Surgeon: Jordis Laneta FALCON, MD;  Location: [facility];  Service: General;                Laterality: N/A; 2016,  2018: MICRODISCECTOMY LUMBAR No date: PILONIDAL CYST EXCISION  BMI    Body Mass Index: 34.38 kg/m      Reproductive/Obstetrics negative OB ROS                              Anesthesia Physical Anesthesia Plan  ASA: 3  Anesthesia Plan: General   Post-op Pain Management:    Induction: Intravenous  PONV Risk Score and Plan: Propofol  infusion and TIVA  Airway Management Planned: Natural Airway and Nasal Cannula  Additional Equipment:   Intra-op Plan:   Post-operative Plan:   Informed Consent: I have reviewed the patients History and Physical, chart, labs and discussed the procedure including the risks, benefits and alternatives for the proposed anesthesia with the patient or authorized representative who has indicated his/her understanding and acceptance.     Dental Advisory Given  Plan Discussed with: CRNA  Anesthesia Plan Comments:          Anesthesia Quick Evaluation

## 2023-11-27 NOTE — H&P (Signed)
 Pre-Procedure H&P   Patient ID: Alexa Abbott is a 73 y.o. female.  Gastroenterology Provider: [name], DO  Referring Provider: Romero Antigua, PA PCP: Glover Lenis, MD  Date: 11/27/2023  HPI Ms. Alexa Abbott is a 73 y.o. female who presents today for Esophagogastroduodenoscopy for GERD, dysphagia .  Patient has noted food sticking in her chest with discomfort since May 2025.  She has noted increased belching.  This occurs once a month, however, pain can last upwards of 3 days  Last EGD was performed in May 2024 with H. pylori negative gastritis and duodenitis.  Biopsies negative for celiac disease.  Grade a esophagitis.  A 48 Alexa Abbott was passed without change in the mucosa.  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/01/2015   Kidney stone    stones in right kidney   Low urine output 06/06/2012   Numbness in right leg 10/09/2014   Pancreatitis 09/2018   required hospitalization   Plantar fasciitis    PONV (postoperative nausea and vomiting)    Renal insufficiency    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   Sacroiliac joint pain 10/22/2015   SBO (small bowel obstruction) (HCC)    Sleep apnea 09/2018   needs a cpap but waiting for 2nd test   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   Type 2 diabetes mellitus (HCC)     Past Surgical History:  Procedure Laterality Date   ABDOMINAL HYSTERECTOMY     APPENDECTOMY     BACK SURGERY     CATARACT EXTRACTION W/PHACO  Left 12/21/2018   Procedure: CATARACT EXTRACTION PHACO AND INTRAOCULAR LENS PLACEMENT (IOC) LEFT DIABETIC TORIC LENS;  Surgeon: Jaye Fallow, MD;  Location: [facility];  Service: Ophthalmology;  Laterality: Left;  US  00:28.9 CDE 4.47 Fluid Pack Lot # [id number]   CATARACT EXTRACTION W/PHACO Right 03/19/2019   Procedure: CATARACT EXTRACTION PHACO AND INTRAOCULAR LENS PLACEMENT (IOC) RIGHT TORIC LENS DIABETIC 5.46 00:42.1;  Surgeon: Jaye Fallow, MD;  Location: [facility];  Service: Ophthalmology;  Laterality: Right;  Diabetic - oral meds   CHOLECYSTECTOMY     COLONOSCOPY     COLONOSCOPY WITH PROPOFOL  N/A 07/22/2022   Procedure: COLONOSCOPY WITH PROPOFOL ;  Surgeon: Jungling Elspeth Ozell, DO;  Location[facility];  Service: Gastroenterology;  Laterality: N/A;   ESOPHAGOGASTRODUODENOSCOPY (EGD) WITH PROPOFOL  N/A 07/22/2022   Procedure: ESOPHAGOGASTRODUODENOSCOPY (EGD) WITH PROPOFOL ;  Surgeon: Jungling Elspeth Ozell, DO;  Location: [facility];  Service: Gastroenterology;  Laterality: N/A;   EYE SURGERY     LAPAROTOMY N/A 08/14/2017   Procedure: EXPLORATORY LAPAROTOMY;  Surgeon: Jordis Laneta FALCON, MD;  Location: [facility];  Service: General;  Laterality: N/A;   MICRODISCECTOMY LUMBAR  2016, 2018   PILONIDAL CYST EXCISION      Family History Mother- crohns No other h/o GI disease or malignancy  Review of Systems  Constitutional:  Negative for activity change, appetite change, chills, diaphoresis, fatigue, fever and unexpected weight  change.  HENT:  Positive for trouble swallowing. Negative for voice change.   Respiratory:  Negative for shortness of breath and wheezing.   Cardiovascular:  Negative for chest pain, palpitations and leg swelling.  Gastrointestinal:  Negative for abdominal distention, abdominal pain, anal bleeding, blood in stool, constipation, diarrhea, nausea, rectal pain and vomiting.  Musculoskeletal:  Negative for arthralgias and myalgias.  Skin:  Negative for  color change and pallor.  Neurological:  Negative for dizziness, syncope and weakness.  Psychiatric/Behavioral:  Negative for confusion.   All other systems reviewed and are negative.    Medications No current facility-administered medications on file prior to encounter.   Current Outpatient Medications on File Prior to Encounter  Medication Sig Dispense Refill   diphenhydrAMINE  (BENADRYL ) 25 mg capsule Take 25 mg by mouth at bedtime.     Semaglutide,0.25 or 0.5MG /DOS, (OZEMPIC, 0.25 OR 0.5 MG/DOSE,) 2 MG/1.5ML SOPN Inject 2 mg into the skin once a week.     BAYER CONTOUR TEST test strip TEST ONCE D UTD  3   Biotin 5000 MCG TABS Take 5,000 mcg by mouth daily.     docusate sodium (COLACE) 100 MG capsule Take 100 mg by mouth at bedtime.     famotidine  (PEPCID ) 20 MG tablet Take 20 mg by mouth 2 (two) times daily.     loperamide (IMODIUM) 2 MG capsule Take 2 mg by mouth daily.     loratadine (CLARITIN) 10 MG tablet Take 10 mg by mouth daily. IN THE MORNING     meloxicam (MOBIC) 15 MG tablet Take 15 mg by mouth daily as needed for pain.     metFORMIN (GLUCOPHAGE-XR) 500 MG 24 hr tablet Take 500 mg by mouth every evening.  (Patient not taking: Reported on 11/27/2023)     metroNIDAZOLE (METROCREAM) 0.75 % cream Apply 1 application topically 2 (two) times daily.     potassium citrate (UROCIT-K) 10 MEQ (1080 MG) SR tablet Take 20 mEq by mouth 2 (two) times daily. BREAKFAST AND BEDTIME     predniSONE  (STERAPRED UNI-PAK 21 TAB) 10 MG (21) TBPK tablet Take 6 pills day one and then decrease by 1 pill each day 21 tablet 0    Pertinent medications related to GI and procedure were reviewed by me with the patient prior to the procedure   Current Facility-Administered Medications:    0.9 %  sodium chloride  infusion, , Intravenous, Continuous, Onita Elspeth Sharper, DO  sodium chloride          Allergies  Allergen Reactions   Doxycycline Other (See Comments)    Doxycycline-induced pancreatitis    Adhesive [Tape] Dermatitis    PLEASE ONLY USE PAPER TAPE   Codeine Itching   Penicillins Rash    Has patient had a PCN reaction causing immediate rash, facial/tongue/throat swelling, SOB or lightheadedness with hypotension: yes Has patient had a PCN reaction causing severe rash involving mucus membranes or skin necrosis: no Has patient had a PCN reaction that required hospitalization no Has patient had a PCN reaction occurring within the last 10 years: no If all of the above answers are NO, then may proceed with Cephalosporin use.    Allergies were reviewed by me prior to the procedure  Objective   Body mass index is 34.38 kg/m. Vitals:   11/27/23 0813 11/27/23 0830  BP:  (!) 144/61  Pulse:  81  Resp:  16  Temp:  (!) 96.8 F (36 C)  TempSrc:  Temporal  SpO2:  96%  Weight: 81.2 kg  Height: 5' 0.5 (1.537 m)      Physical Exam Vitals and nursing note reviewed.  Constitutional:      General: She is not in acute distress.    Appearance: Normal appearance. She is obese. She is not ill-appearing, toxic-appearing or diaphoretic.  HENT:     Head: Normocephalic and atraumatic.     Nose: Nose normal.     Mouth/Throat:     Mouth: Mucous membranes are moist.     Pharynx: Oropharynx is clear.  Eyes:     General: No scleral icterus.    Extraocular Movements: Extraocular movements intact.  Cardiovascular:     Rate and Rhythm: Normal rate and regular rhythm.     Heart sounds: Normal heart sounds. No murmur heard.    No friction rub. No gallop.  Pulmonary:     Effort: Pulmonary effort is normal. No respiratory distress.     Breath sounds: Normal breath sounds. No wheezing, rhonchi or rales.  Abdominal:     General: Bowel sounds are normal. There is no distension.     Palpations: Abdomen is soft.     Tenderness: There is no abdominal tenderness. There is no guarding or rebound.  Musculoskeletal:     Cervical back: Neck supple.     Right lower leg: No edema.     Left lower  leg: No edema.  Skin:    General: Skin is warm and dry.     Coloration: Skin is not jaundiced or pale.  Neurological:     General: No focal deficit present.     Mental Status: She is alert and oriented to person, place, and time. Mental status is at baseline.  Psychiatric:        Mood and Affect: Mood normal.        Behavior: Behavior normal.        Thought Content: Thought content normal.        Judgment: Judgment normal.      Assessment:  Ms. SHIRRELL SOLINGER is a 73 y.o. female  who presents today for Esophagogastroduodenoscopy for GERD, dysphagia .  Plan:  Esophagogastroduodenoscopy with possible intervention today  Esophagogastroduodenoscopy with possible biopsy, control of bleeding, polypectomy, and interventions as necessary has been discussed with the patient/patient representative. Informed consent was obtained from the patient/patient representative after explaining the indication, nature, and risks of the procedure including but not limited to death, bleeding, perforation, missed neoplasm/lesions, cardiorespiratory compromise, and reaction to medications. Opportunity for questions was given and appropriate answers were provided. Patient/patient representative has verbalized understanding is amenable to undergoing the procedure.   [name], DO  [facility]  Portions of the record may have been created with voice recognition software. Occasional wrong-word or 'sound-a-like' substitutions may have occurred due to the inherent limitations of voice recognition software.  Read the chart carefully and recognize, using context, where substitutions may have occurred.
